# Patient Record
Sex: Female | Born: 1995 | Race: Black or African American | Hispanic: No | Marital: Single | Smoking: Never smoker
Health system: Southern US, Community
[De-identification: ages and names within clinical notes are randomized; demographics above are authoritative.]

## PROBLEM LIST (undated history)

## (undated) ENCOUNTER — Ambulatory Visit: Admission: EM | Discharge status: Absent without leave | Payer: Self-pay

## (undated) DIAGNOSIS — R7989 Other specified abnormal findings of blood chemistry: Secondary | ICD-10-CM

## (undated) DIAGNOSIS — K219 Gastro-esophageal reflux disease without esophagitis: Secondary | ICD-10-CM

## (undated) DIAGNOSIS — J45909 Unspecified asthma, uncomplicated: Secondary | ICD-10-CM

## (undated) DIAGNOSIS — H409 Unspecified glaucoma: Secondary | ICD-10-CM

## (undated) HISTORY — PX: WISDOM TOOTH EXTRACTION: SHX21

## (undated) HISTORY — PX: MOUTH SURGERY: SHX715

## (undated) HISTORY — PX: DILATION AND CURETTAGE OF UTERUS: SHX78

---
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
## 2013-10-06 IMAGING — CR DG THORACIC SPINE 2-3V
1 series · 2 of 2 positions shown · non-contrast
Comparison: none

REASON FOR EXAM: t spine tenderness s/p fall
COMMENTS:

PROCEDURE:     DXR - DXR THORACIC  AP AND LATERAL  - November 10, 2011  [DATE]
RESULT:     No acute bony abnormality identified. No evidence of fracture.
No focal abnormality. Mild scoliosis concave right.

[Series 1: ap · 0.17mm/px · 2 of 2 slices shown]
[im 1/2]
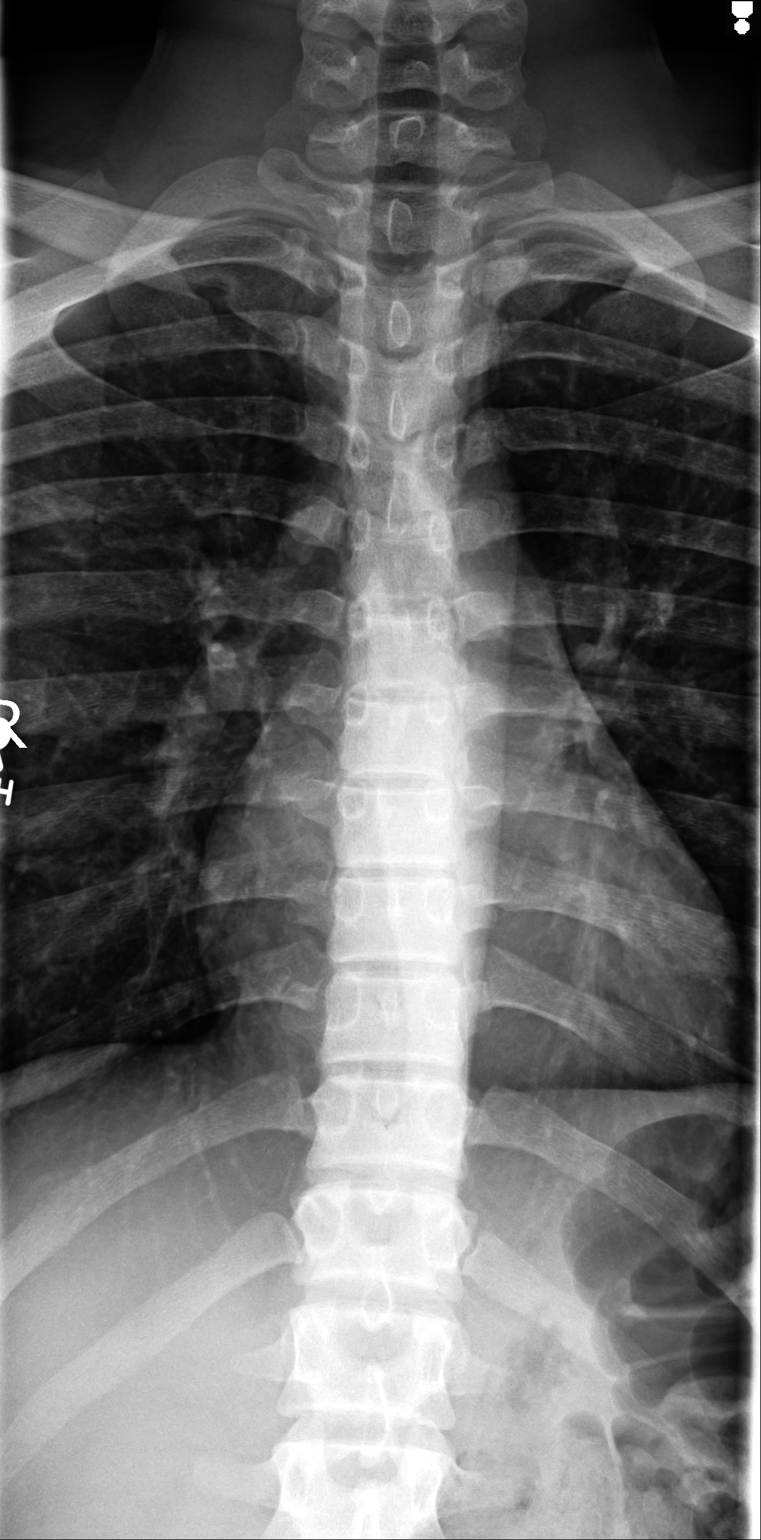
[im 2/2]
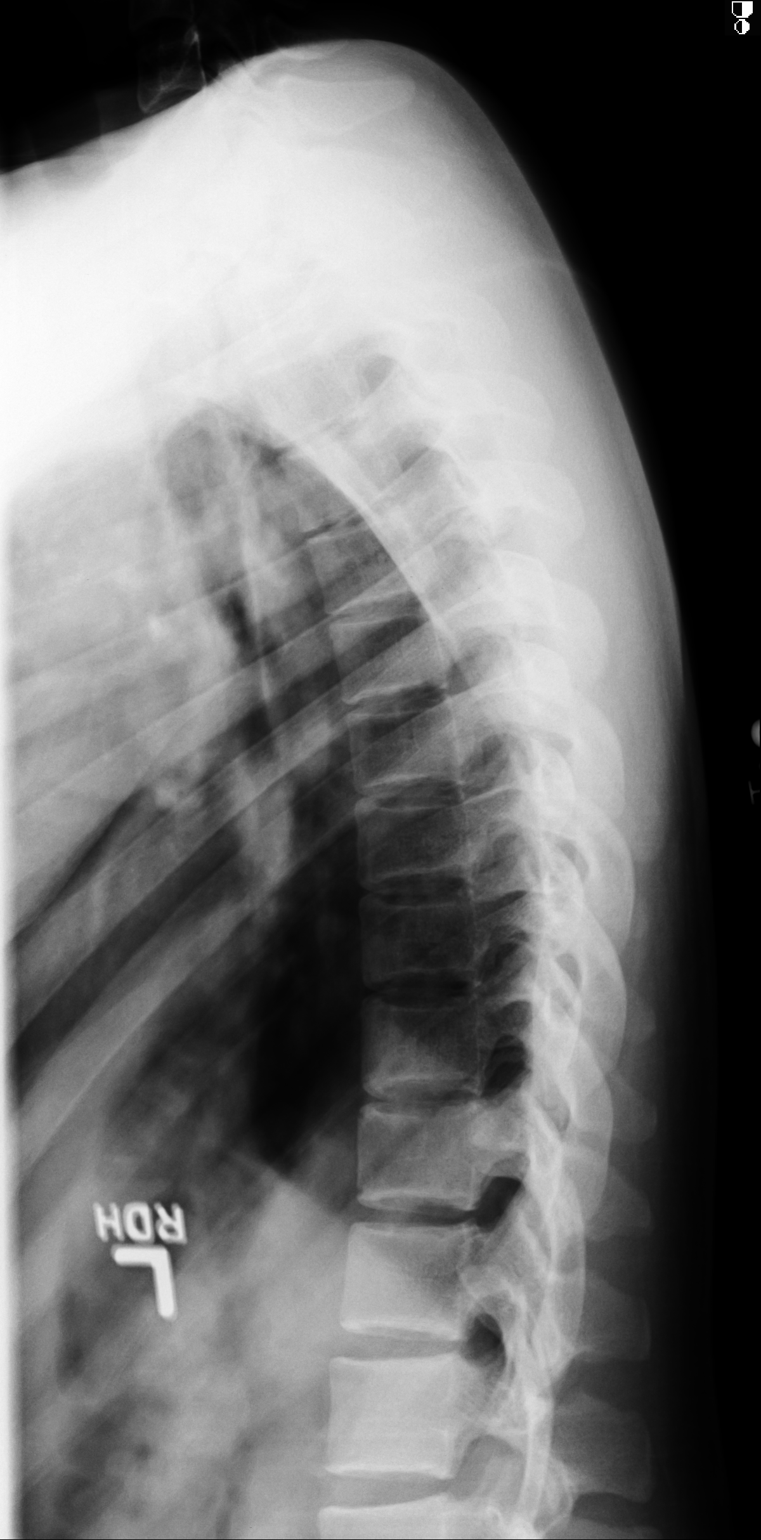

[2 of 2 positions shown; findings below may reference images not displayed]

IMPRESSION: No acute abnormality.

## 2015-04-19 ENCOUNTER — Encounter: Payer: Self-pay | Admitting: Emergency Medicine

## 2015-04-19 ENCOUNTER — Ambulatory Visit
Admission: EM | Admit: 2015-04-19 | Discharge: 2015-04-19 | Disposition: A | Discharge status: Home / Self Care | Payer: Medicaid Other | Attending: Family Medicine | Admitting: Family Medicine

## 2015-04-19 DIAGNOSIS — N390 Urinary tract infection, site not specified: Secondary | ICD-10-CM | POA: Insufficient documentation

## 2015-04-19 DIAGNOSIS — R350 Frequency of micturition: Secondary | ICD-10-CM | POA: Insufficient documentation

## 2015-04-19 HISTORY — DX: Unspecified asthma, uncomplicated: J45.909

## 2015-04-19 LAB — URINALYSIS COMPLETE WITH MICROSCOPIC (ARMC ONLY)
Bilirubin Urine: NEGATIVE
Glucose, UA: NEGATIVE mg/dL
Hgb urine dipstick: NEGATIVE
Ketones, ur: NEGATIVE mg/dL
Leukocytes, UA: NEGATIVE
NITRITE: NEGATIVE
PH: 6 (ref 5.0–8.0)
Protein, ur: NEGATIVE mg/dL
Specific Gravity, Urine: 1.025 (ref 1.005–1.030)

## 2015-04-19 MED ORDER — CIPROFLOXACIN HCL 500 MG PO TABS: 500.0000 mg | ORAL_TABLET | Freq: Two times a day (BID) | ORAL | Status: DC

## 2015-04-19 MED ORDER — PHENAZOPYRIDINE HCL 200 MG PO TABS: 200.0000 mg | ORAL_TABLET | Freq: Three times a day (TID) | ORAL | Status: DC | PRN

## 2015-04-19 NOTE — ED Notes (Signed)
Urinary symptoms for 2 weeks, felt like she was getting better but now getting worse.

## 2015-04-19 NOTE — ED Provider Notes (Signed)
CSN: [id number]     Arrival date & time 04/19/15  0805 History   First MD Initiated Contact with Patient 04/19/15 [phone]    Nurses notes were reviewed. Chief Complaint  Patient presents with  . Urinary Frequency   Patient reports one-day history of burning urination frequency. Many UTIs but has noted them when she has relation with a partner she seems to develop a UTI. She has some burning urination frequency.   (Consider location/radiation/quality/duration/timing/severity/associated sxs/prior Treatment) Patient is a 19 y.o. female presenting with frequency. The history is provided by the patient. No language interpreter was used.  Urinary Frequency This is a new problem. The current episode started yesterday. The problem occurs constantly. The problem has been gradually worsening. Pertinent negatives include no chest pain, no abdominal pain, no headaches and no shortness of breath. Nothing aggravates the symptoms. Nothing relieves the symptoms. She has tried nothing for the symptoms. The treatment provided no relief.    Past Medical History  Diagnosis Date  . Asthma    Past Surgical History  Procedure Laterality Date  . Mouth surgery     History reviewed. No pertinent family history. Social History  Substance Use Topics  . Smoking status: Former Games developer  . Smokeless tobacco: None  . Alcohol Use: Yes   OB History    No data available     Review of Systems  Constitutional: Negative for activity change, appetite change and fatigue.  Respiratory: Negative for shortness of breath.   Cardiovascular: Negative for chest pain.  Gastrointestinal: Negative for abdominal pain.  Genitourinary: Positive for frequency.  Neurological: Negative for headaches.  All other systems reviewed and are negative.   Allergies  Honey bee treatment  Home Medications   Prior to Admission medications   Medication Sig Start Date End Date Taking? Authorizing Provider  ALBUTEROL IN Inhale 90 mcg  into the lungs as needed.   Yes Historical Provider, MD  ciprofloxacin (CIPRO) 500 MG tablet Take 1 tablet (500 mg total) by mouth 2 (two) times daily. 04/19/15   Hassan Rowan, MD  phenazopyridine (PYRIDIUM) 200 MG tablet Take 1 tablet (200 mg total) by mouth 3 (three) times daily as needed for pain. 04/19/15   Hassan Rowan, MD   Meds Ordered and Administered this Visit  Medications - No data to display  BP 110/61 mmHg  Pulse 79  Temp(Src) 97.9 F (36.6 C) (Oral)  Resp 18  Ht  (1.676 m)  Wt 230 lb (104.327 kg)  BMI 37.14 kg/m2  SpO2 100%  LMP 04/19/2015 (Approximate) No data found.   Physical Exam  Constitutional: She appears well-developed and well-nourished.  HENT:  Head: Normocephalic and atraumatic.  Eyes: Conjunctivae are normal. Pupils are equal, round, and reactive to light.  Abdominal: Soft. Bowel sounds are normal. She exhibits no distension.  Musculoskeletal: Normal range of motion.  Neurological: She is alert.  Skin: Skin is warm and dry.  Psychiatric: She has a normal mood and affect. Her behavior is normal.  Vitals reviewed.   ED Course  Procedures (including critical care time)  Labs Review Labs Reviewed  URINALYSIS COMPLETEWITH MICROSCOPIC ([facility] ONLY) - Abnormal; Notable for the following:    APPearance HAZY (*)    Bacteria, UA MANY (*)    Squamous Epithelial / LPF 0-5 (*)    All other components within normal limits  URINE CULTURE    Imaging Review No results found.   Visual Acuity Review  Right Eye Distance:   Left Eye Distance:  Bilateral Distance:    Right Eye Near:   Left Eye Near:    Bilateral Near:     Results for orders placed or performed during the hospital encounter of 04/19/15  Urinalysis complete, with microscopic  Result Value Ref Range   Color, Urine YELLOW YELLOW   APPearance HAZY (A) CLEAR   Glucose, UA NEGATIVE NEGATIVE mg/dL   Bilirubin Urine NEGATIVE NEGATIVE   Ketones, ur NEGATIVE NEGATIVE mg/dL   Specific  Gravity, Urine 1.025 1.005 - 1.030   Hgb urine dipstick NEGATIVE NEGATIVE   pH 6.0 5.0 - 8.0   Protein, ur NEGATIVE NEGATIVE mg/dL   Nitrite NEGATIVE NEGATIVE   Leukocytes, UA NEGATIVE NEGATIVE   RBC / HPF 6-30 0 - 5 RBC/hpf   WBC, UA 0-5 0 - 5 WBC/hpf   Bacteria, UA MANY (A) NONE SEEN   Squamous Epithelial / LPF 0-5 (A) NONE SEEN   Mucous PRESENT      MDM   1. UTI (lower urinary tract infection)     Patient retrieved for UTI with Cipro for a week. Discussed further about the need to always wipe down and the need to go to the bathroom before sexual relations and going afterwards as decided needed. Work note written for today as well.      Hassan RowanEugene Charlaine Utsey, MD 04/19/15 1006

## 2015-04-19 NOTE — Discharge Instructions (Signed)
Antibiotic Medicine °Antibiotic medicines are used to treat infections caused by bacteria. They work by hurting or killing the germs that are making you sick. °HOW WILL MY MEDICINE BE PICKED? °There are many kinds of antibiotic medicines. To help your doctor pick one, tell your doctor if: °· You have any allergies. °· You are pregnant or plan to get pregnant. °· You are breastfeeding. °· You are taking any medicines. These include over-the-counter medicines, prescription medicines, and herbal remedies. °· You have a medical condition or problem. °If you have questions about why your medicine was picked, ask. °FOR HOW LONG SHOULD I TAKE MY MEDICINE? °Take your medicine for as long as your doctor tells you to. Do not stop taking it when you feel better. If you stop taking it too soon: °· You may start to feel sick again. °· Your infection may get harder to treat. °· New problems may develop. °WHAT IF I MISS A DOSE? °Try not to miss any doses of antibiotic medicine. If you miss a dose: °· Take the dose as soon as you can. °· If you are taking 2 doses a day, take the next dose in 5 to 6 hours. °· If you are taking 3 or more doses a day, take the next dose in 2 to 4 hours. Then go back to the normal schedule. °If you cannot take a missed dose, take the next dose on time. Then take the missed dose after you have taken all the doses as told by your doctor, as if you had one more dose left. °DOES THIS MEDICINE AFFECT BIRTH CONTROL? °Birth control pills may not work while you are on antibiotic medicines. If you are taking birth control pills, keep taking them as usual. Use a second form of birth control, such as a condom. Keep using the second form of birth control until you are finished with your current 1 month cycle of birth control pills. °GET HELP IF: °· You get worse. °· You do not feel better a few days after starting the medicine. °· You throw up (vomit). °· There are white patches in your mouth. °· You have new  joint pain after starting the medicine. °· You have new muscle aches after starting the medicine. °· You had a fever before starting the medicine, and it comes back. °· You have any symptoms of an allergic reaction, such as an itchy rash. If this happens, stop taking the medicine. °GET HELP RIGHT AWAY IF: °· Your pee (urine) turns dark or becomes blood-colored. °· Your skin turns yellow. °· You bruise or bleed easily. °· You have very bad watery poop (diarrhea) and cramps in your belly (abdomen). °· You have a very bad headache. °· You have signs of a very bad allergic reaction, such as: °¨ Trouble breathing. °¨ Wheezing. °¨ Swelling of the lips, tongue, or face. °¨ Fainting. °¨ Blisters on the skin or in the mouth. °If you have signs of a very bad allergic reaction, stop taking the antibiotic medicine right away. °  °This information is not intended to replace advice given to you by your health care provider. Make sure you discuss any questions you have with your health care provider. °  °Document Released: 01/17/2008 Document Revised: 12/29/2014 Document Reviewed: 08/25/2014 °Elsevier Interactive Patient Education ©2016 [employer] ° °Urinary Tract Infection °A urinary tract infection (UTI) can occur any place along the urinary tract. The tract includes the kidneys, ureters, bladder, and urethra. A type of germ called bacteria   often causes a UTI. UTIs are often helped with antibiotic medicine.  °HOME CARE  °· If given, take antibiotics as told by your doctor. Finish them even if you start to feel better. °· Drink enough fluids to keep your pee (urine) clear or pale yellow. °· Avoid tea, drinks with caffeine, and bubbly (carbonated) drinks. °· Pee often. Avoid holding your pee in for a long time. °· Pee before and after having sex (intercourse). °· Wipe from front to back after you poop (bowel movement) if you are a woman. Use each tissue only once. °GET HELP RIGHT AWAY IF:  °· You have back pain. °· You have  lower belly (abdominal) pain. °· You have chills. °· You feel sick to your stomach (nauseous). °· You throw up (vomit). °· Your burning or discomfort with peeing does not go away. °· You have a fever. °· Your symptoms are not better in 3 days. °MAKE SURE YOU:  °· Understand these instructions. °· Will watch your condition. °· Will get help right away if you are not doing well or get worse. °  °This information is not intended to replace advice given to you by your health care provider. Make sure you discuss any questions you have with your health care provider. °  °Document Released: 09/26/2007 Document Revised: 04/30/2014 Document Reviewed: 11/08/2011 °Elsevier Interactive Patient Education ©2016 [employer]vier Inc. ° °

## 2015-04-21 LAB — URINE CULTURE: Special Requests: NORMAL

## 2016-02-11 ENCOUNTER — Ambulatory Visit
Admission: EM | Admit: 2016-02-11 | Discharge: 2016-02-11 | Discharge status: Against Medical Advice | Payer: Medicaid Other

## 2016-02-11 DIAGNOSIS — T63441A Toxic effect of venom of bees, accidental (unintentional), initial encounter: Secondary | ICD-10-CM

## 2016-02-11 NOTE — ED Provider Notes (Signed)
CSN: [id number]     Arrival date & time 02/11/16  1058 History   None    Chief Complaint  Patient presents with  . Insect Bite    Bee Sting   (Consider location/radiation/quality/duration/timing/severity/associated sxs/prior Treatment) Single female left prior to provider evaluation during nurse triage bee sting right finger talking on phone full sentences without difficulty stated to nurse she has benadryl at home and father at home.  Will seek re-evaluation if new or worsening symptoms departed ambulatory in NAD respirations even and unlabored gait sure and steady in hall  PMHx allergy honey ingestion anaphylaxis      Past Medical History:  Diagnosis Date  . Asthma    Past Surgical History:  Procedure Laterality Date  . MOUTH SURGERY     No family history on file. Social History  Substance Use Topics  . Smoking status: Former Games developermoker  . Smokeless tobacco: Not on file  . Alcohol use Yes   OB History    No data available     Review of Systems  Constitutional: Negative for chills and fever.  HENT: Negative for ear pain and sore throat.   Eyes: Negative for pain and visual disturbance.  Respiratory: Negative for cough and shortness of breath.   Cardiovascular: Negative for chest pain and palpitations.  Gastrointestinal: Negative for abdominal pain and vomiting.  Genitourinary: Negative for dysuria and hematuria.  Musculoskeletal: Negative for arthralgias and back pain.  Skin: Positive for color change. Negative for rash.  Allergic/Immunologic: Positive for environmental allergies and food allergies.  Neurological: Negative for dizziness, tremors, seizures, syncope, facial asymmetry, speech difficulty, weakness, light-headedness, numbness and headaches.  Hematological: Negative for adenopathy. Does not bruise/bleed easily.  Psychiatric/Behavioral: Negative for sleep disturbance.  All other systems reviewed and are negative.   Allergies  Honey bee treatment [bee  venom]  Home Medications   Prior to Admission medications   Medication Sig Start Date End Date Taking? Authorizing Provider  ALBUTEROL IN Inhale 90 mcg into the lungs as needed.    Historical Provider, MD  ciprofloxacin (CIPRO) 500 MG tablet Take 1 tablet (500 mg total) by mouth 2 (two) times daily. 04/19/15   Hassan RowanEugene Wade, MD  phenazopyridine (PYRIDIUM) 200 MG tablet Take 1 tablet (200 mg total) by mouth 3 (three) times daily as needed for pain. 04/19/15   Hassan RowanEugene Wade, MD   Meds Ordered and Administered this Visit  Medications - No data to display  There were no vitals taken for this visit. No data found.   Physical Exam  Constitutional: She is oriented to person, place, and time. She appears well-developed and well-nourished. No distress.  HENT:  Head: Normocephalic and atraumatic.  Right Ear: External ear normal.  Left Ear: External ear normal.  Mouth/Throat: Oropharynx is clear and moist.  Eyes: Conjunctivae and EOM are normal. Pupils are equal, round, and reactive to light. Right eye exhibits no discharge. Left eye exhibits no discharge. No scleral icterus.  Neck: Normal range of motion. Neck supple.  Cardiovascular: Normal rate and regular rhythm.   Pulmonary/Chest: Effort normal and breath sounds normal. No stridor. No respiratory distress.  Musculoskeletal: Normal range of motion.  Neurological: She is alert and oriented to person, place, and time.  Skin: Skin is warm and dry. Capillary refill takes less than 2 seconds. She is not diaphoretic.  Psychiatric: She has a normal mood and affect. Her behavior is normal. Judgment and thought content normal.  Nursing note and vitals reviewed.   Urgent Care Course  Clinical Course    Procedures (including critical care time)  Labs Review Labs Reviewed - No data to display  Imaging Review No results found.       MDM   1. Bee sting, accidental or unintentional, initial encounter    Seek re-evaluation if worsening  of symptoms e.g. Dyspnea/dysphagia/tongue swelling/shortness of breath/vomiting.    Barbaraann Barthel, NP 02/11/16 1524

## 2016-02-11 NOTE — ED Notes (Signed)
Patient was called back to the exam room. Upon entering exam room she decided she didn't need to be seen, that she was going to put something cold on it and take some Benadryl, RN advised her to let the provider see her however she decided to leave.

## 2016-02-12 ENCOUNTER — Ambulatory Visit
Admission: EM | Admit: 2016-02-12 | Discharge: 2016-02-12 | Disposition: A | Discharge status: Home / Self Care | Payer: Medicaid Other | Attending: Family Medicine | Admitting: Family Medicine

## 2016-02-12 DIAGNOSIS — T63441A Toxic effect of venom of bees, accidental (unintentional), initial encounter: Secondary | ICD-10-CM

## 2016-02-12 DIAGNOSIS — L299 Pruritus, unspecified: Secondary | ICD-10-CM | POA: Diagnosis not present

## 2016-02-12 MED ORDER — TRIAMCINOLONE ACETONIDE 0.1 % EX CREA: 1.0000 "application " | TOPICAL_CREAM | Freq: Two times a day (BID) | CUTANEOUS | 0 refills | Status: DC

## 2016-02-12 NOTE — ED Provider Notes (Signed)
CSN: [id number]     Arrival date & time 02/12/16  1549 History   None    Chief Complaint  Patient presents with  . Insect Bite   (Consider location/radiation/quality/duration/timing/severity/associated sxs/prior Treatment) HPI  This a 20 year old female who is seen today because of a yellow jacket sting to the dorsum of her right ring finger that occurred yesterday. She was actually here in the clinic yesterday but left about being seen because of reaction not being to severe". She decided to leave and they use Benadryl at home. 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 Today she returns complaining that her hand is very itchy and still swollen. She needs a note for return to work.     Past Medical History:  Diagnosis Date  . Asthma    Past Surgical History:  Procedure Laterality Date  . MOUTH SURGERY     History reviewed. No pertinent family history. Social History  Substance Use Topics  . Smoking status: Former Games developermoker  . Smokeless tobacco: Never Used  . Alcohol use Yes   OB History    No data available     Review of Systems  Constitutional: Positive for activity change. Negative for chills, fatigue and fever.  Skin: Positive for wound.    Allergies  Honey bee treatment [bee venom]  Home Medications   Prior to Admission medications   Medication Sig Start Date End Date Taking? Authorizing Provider  ALBUTEROL IN Inhale 90 mcg into the lungs as needed.   Yes Historical Provider, MD  diphenhydrAMINE (BENADRYL) 25 mg capsule Take 25 mg by mouth every 6 (six) hours as needed.   Yes Historical Provider, MD  triamcinolone cream (KENALOG) 0.1 % Apply 1 application topically 2 (two) times daily. 02/12/16   Lutricia FeilWilliam P Roemer, PA-C   Meds Ordered and Administered this Visit  Medications - No data to display  BP 110/78 (BP Location: Left Arm)   Pulse 75   Temp 98 F (36.7 C) (Oral)   Resp 18   Ht 5\' 6"  (1.676 m)   Wt 230 lb (104.3 kg)   LMP 02/12/2016   SpO2 99%   BMI 37.12  kg/m  No data found.   Physical Exam  Constitutional: She is oriented to person, place, and time. She appears well-developed and well-nourished. No distress.  HENT:  Head: Normocephalic and atraumatic.  Eyes: EOM are normal. Pupils are equal, round, and reactive to light.  Neck: Normal range of motion. Neck supple.  Pulmonary/Chest: Effort normal and breath sounds normal. No respiratory distress. She has no wheezes. She has no rales.  Musculoskeletal: Normal range of motion. She exhibits edema and tenderness.  Neurological: She is alert and oriented to person, place, and time.  Skin: Skin is warm and dry. She is not diaphoretic. There is erythema.  Examination of the right dominant ring finger over the dorsum of the PIP joint is some erythema swelling erythema extending down her finger to the MP joints. Range of motion is full but on, 4 at the very extremes of flexion due to the swelling. Swelling is very minimal.  Psychiatric: She has a normal mood and affect. Her behavior is normal. Judgment and thought content normal.  Nursing note and vitals reviewed.   Urgent Care Course   Clinical Course    Procedures (including critical care time)  Labs Review Labs Reviewed - No data to display  Imaging Review No results found.   Visual Acuity Review  Right Eye Distance:  Left Eye Distance:   Bilateral Distance:    Right Eye Near:   Left Eye Near:    Bilateral Near:         MDM   1. Bee sting, accidental or unintentional, initial encounter    Discharge Medication List as of 02/12/2016  4:15 PM    START taking these medications   Details  triamcinolone cream (KENALOG) 0.1 % Apply 1 application topically 2 (two) times daily., Starting Sun 02/12/2016, Normal      Plan: 1. Test/x-ray results and diagnosis reviewed with patient 2. rx as per orders; risks, benefits, potential side effects reviewed with patient 3. Recommend supportive treatment with Ice and elevation as  necessary for comfort. Will prescribe some steroid cream to help with the itching. I recommended that she use H2 blockers for several days also may use either Claritin or Allegra cause it is less sedating. Use Benadryl nighttime. Follow-up when necessary 4. F/u prn if symptoms worsen or don't improve     Lutricia Feil, PA-C 02/12/16 1631

## 2016-02-12 NOTE — ED Triage Notes (Signed)
Patient was stung by a yellow jacket yesterday morning. Finger is swollen and hurting

## 2016-02-15 ENCOUNTER — Telehealth: Payer: Self-pay

## 2016-02-15 NOTE — Telephone Encounter (Signed)
Courtesy call back completed today after patient's visit at [facility]. Patient improved and will call back with any questions or concerns.  

## 2016-03-07 ENCOUNTER — Ambulatory Visit
Admission: EM | Admit: 2016-03-07 | Discharge: 2016-03-07 | Disposition: A | Discharge status: Home / Self Care | Payer: Medicaid Other | Attending: Family Medicine | Admitting: Family Medicine

## 2016-03-07 DIAGNOSIS — N390 Urinary tract infection, site not specified: Secondary | ICD-10-CM | POA: Insufficient documentation

## 2016-03-07 DIAGNOSIS — B3731 Acute candidiasis of vulva and vagina: Secondary | ICD-10-CM

## 2016-03-07 DIAGNOSIS — B373 Candidiasis of vulva and vagina: Secondary | ICD-10-CM

## 2016-03-07 LAB — URINALYSIS COMPLETE WITH MICROSCOPIC (ARMC ONLY)
GLUCOSE, UA: NEGATIVE mg/dL
NITRITE: NEGATIVE
Protein, ur: 30 mg/dL — AB
pH: 5.5 (ref 5.0–8.0)

## 2016-03-07 MED ORDER — FLUCONAZOLE 150 MG PO TABS: 150.0000 mg | ORAL_TABLET | Freq: Every day | ORAL | 1 refills | Status: DC

## 2016-03-07 NOTE — ED Triage Notes (Signed)
Pt c/o urinary discomfort. She was treated for Chlamydia a couple weeks ago. But she is still having some discharge.

## 2016-03-07 NOTE — ED Provider Notes (Signed)
[facility]    CSN: [id number] Arrival date & time: 03/07/16  1340     History   Chief Complaint Chief Complaint  Patient presents with  . Urinary Tract Infection    HPI Ashley Morrow is a 20 y.o. female.   20 yo female with a c/o urinary discomfort. States she was treated for Chlamydia a couple weeks ago and symptoms improved. She was also treated for a UTI the week prior.  Now reports she is still having some mild discharge and itching, both of which improved with otc monistat for several days. States that since the treatment for chlamydia, she has not had any intercourse or other sexual activity.    The history is provided by the patient.    Past Medical History:  Diagnosis Date  . Asthma     There are no active problems to display for this patient.   Past Surgical History:  Procedure Laterality Date  . MOUTH SURGERY      OB History    No data available       Home Medications    Prior to Admission medications   Medication Sig Start Date End Date Taking? Authorizing Provider  ALBUTEROL IN Inhale 90 mcg into the lungs as needed.    Historical Provider, MD  diphenhydrAMINE (BENADRYL) 25 mg capsule Take 25 mg by mouth every 6 (six) hours as needed.    Historical Provider, MD  fluconazole (DIFLUCAN) 150 MG tablet Take 1 tablet (150 mg total) by mouth daily. 03/07/16   Payton Mccallumrlando Debbra Digiulio, MD  triamcinolone cream (KENALOG) 0.1 % Apply 1 application topically 2 (two) times daily. 02/12/16   Lutricia FeilWilliam P Roemer, PA-C    Family History History reviewed. No pertinent family history.  Social History Social History  Substance Use Topics  . Smoking status: Former Games developermoker  . Smokeless tobacco: Never Used  . Alcohol use Yes     Allergies   Honey bee treatment [bee venom]   Review of Systems Review of Systems   Physical Exam Triage Vital Signs ED Triage Vitals  Enc Vitals Group     BP 03/07/16 1402 118/72     Pulse Rate 03/07/16 1402 94     Resp  03/07/16 1402 18     Temp 03/07/16 1402 98 F (36.7 C)     Temp Source 03/07/16 1402 Oral     SpO2 03/07/16 1402 99 %     Weight 03/07/16 1402 230 lb (104.3 kg)     Height 03/07/16 1402 5\' 6"  (1.676 m)     Head Circumference --      Peak Flow --      Pain Score 03/07/16 1403 3     Pain Loc --      Pain Edu? --      Excl. in GC? --    No data found.   Updated Vital Signs BP 118/72 (BP Location: Left Arm)   Pulse 94   Temp 98 F (36.7 C) (Oral)   Resp 18   Ht 5\' 6"  (1.676 m)   Wt 230 lb (104.3 kg)   LMP 02/12/2016   SpO2 99%   BMI 37.12 kg/m   Visual Acuity Right Eye Distance:   Left Eye Distance:   Bilateral Distance:    Right Eye Near:   Left Eye Near:    Bilateral Near:     Physical Exam  Constitutional: She appears well-developed and well-nourished. No distress.  Skin: She is not diaphoretic.  Nursing note and vitals reviewed.    UC Treatments / Results  Labs (all labs ordered are listed, but only abnormal results are displayed) Labs Reviewed  URINALYSIS COMPLETEWITH MICROSCOPIC ([facility] ONLY) - Abnormal; Notable for the following:       Result Value   APPearance CLOUDY (*)    Bilirubin Urine SMALL (*)    Ketones, ur TRACE (*)    Specific Gravity, Urine >1.030 (*)    Hgb urine dipstick TRACE (*)    Protein, ur 30 (*)    Leukocytes, UA MODERATE (*)    Bacteria, UA MANY (*)    Squamous Epithelial / LPF TOO NUMEROUS TO COUNT (*)    All other components within normal limits  URINE CULTURE  CHLAMYDIA/NGC RT PCR ([facility] ONLY)    EKG  EKG Interpretation None       Radiology No results found.  Procedures Procedures (including critical care time)  Medications Ordered in UC Medications - No data to display   Initial Impression / Assessment and Plan / UC Course  I have reviewed the triage vital signs and the nursing notes.  Pertinent labs & imaging results that were available during my care of the patient were reviewed by me and  considered in my medical decision making (see chart for details).  Clinical Course       Final Clinical Impressions(s) / UC Diagnoses   Final diagnoses:  Yeast vaginitis    New Prescriptions New Prescriptions   FLUCONAZOLE (DIFLUCAN) 150 MG TABLET    Take 1 tablet (150 mg total) by mouth daily.   1. Lab results and diagnosis reviewed with patient 2. rx as per orders above; reviewed possible side effects, interactions, risks and benefits  3. Check cultures/tests as per orders (patient will be notified) 4. Recommend supportive treatment with increased water intake 5. Follow-up prn if symptoms worsen or don't improve   Payton Mccallumrlando Barre Aydelott, MD 03/07/16 [phone]

## 2016-03-08 ENCOUNTER — Telehealth: Payer: Self-pay | Admitting: *Deleted

## 2016-03-08 LAB — CHLAMYDIA/NGC RT PCR (ARMC ONLY)
Chlamydia Tr: NOT DETECTED
N gonorrhoeae: NOT DETECTED

## 2016-03-08 LAB — URINE CULTURE
CULTURE: NO GROWTH
Special Requests: NORMAL

## 2016-03-08 NOTE — Telephone Encounter (Signed)
Called patient, verified DOB, communicated negative chlamydia and gonorrhea test results. Patient reported feeling better and confirmed understanding of test results.

## 2016-10-15 ENCOUNTER — Ambulatory Visit
Admission: EM | Admit: 2016-10-15 | Discharge: 2016-10-15 | Disposition: A | Discharge status: Home / Self Care | Payer: Medicaid Other | Attending: Family Medicine | Admitting: Family Medicine

## 2016-10-15 DIAGNOSIS — N898 Other specified noninflammatory disorders of vagina: Secondary | ICD-10-CM

## 2016-10-15 DIAGNOSIS — Z888 Allergy status to other drugs, medicaments and biological substances status: Secondary | ICD-10-CM | POA: Insufficient documentation

## 2016-10-15 DIAGNOSIS — Z87891 Personal history of nicotine dependence: Secondary | ICD-10-CM | POA: Insufficient documentation

## 2016-10-15 DIAGNOSIS — N926 Irregular menstruation, unspecified: Secondary | ICD-10-CM | POA: Insufficient documentation

## 2016-10-15 DIAGNOSIS — J45909 Unspecified asthma, uncomplicated: Secondary | ICD-10-CM | POA: Diagnosis not present

## 2016-10-15 DIAGNOSIS — B373 Candidiasis of vulva and vagina: Secondary | ICD-10-CM

## 2016-10-15 DIAGNOSIS — B3731 Acute candidiasis of vulva and vagina: Secondary | ICD-10-CM

## 2016-10-15 DIAGNOSIS — Z79899 Other long term (current) drug therapy: Secondary | ICD-10-CM | POA: Insufficient documentation

## 2016-10-15 DIAGNOSIS — B9689 Other specified bacterial agents as the cause of diseases classified elsewhere: Secondary | ICD-10-CM

## 2016-10-15 DIAGNOSIS — N760 Acute vaginitis: Secondary | ICD-10-CM | POA: Insufficient documentation

## 2016-10-15 LAB — WET PREP, GENITAL
Sperm: NONE SEEN
TRICH WET PREP: NONE SEEN

## 2016-10-15 MED ORDER — FLUCONAZOLE 150 MG PO TABS: 150.0000 mg | ORAL_TABLET | Freq: Once | ORAL | 0 refills | Status: AC

## 2016-10-15 MED ORDER — METRONIDAZOLE 500 MG PO TABS: 500.0000 mg | ORAL_TABLET | Freq: Two times a day (BID) | ORAL | 0 refills | Status: AC

## 2016-10-15 NOTE — Discharge Instructions (Addendum)
Recommend start Flagyl 500mg  twice a day as directed- do not drink alcohol while on this medication. Take Diflucan 150mg  one tablet now, repeat another tablet in 3 days and final tablet at end of antibiotic use.

## 2016-10-15 NOTE — ED Triage Notes (Signed)
Unprotected sex 4 days ago, then started with yellow vaginal discharge. No urinary sx. Denies pain in triage

## 2016-10-16 LAB — CHLAMYDIA/NGC RT PCR (ARMC ONLY)
Chlamydia Tr: NOT DETECTED
N GONORRHOEAE: NOT DETECTED
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
## 2017-04-23 DIAGNOSIS — J189 Pneumonia, unspecified organism: Secondary | ICD-10-CM

## 2017-04-23 HISTORY — PX: DILATION AND CURETTAGE OF UTERUS: SHX78

## 2017-04-23 HISTORY — DX: Pneumonia, unspecified organism: J18.9

## 2017-08-30 ENCOUNTER — Encounter: Payer: Self-pay | Admitting: Emergency Medicine

## 2017-08-30 ENCOUNTER — Emergency Department
Admission: EM | Admit: 2017-08-30 | Discharge: 2017-08-31 | Disposition: A | Discharge status: Home / Self Care | Payer: Self-pay | Attending: Emergency Medicine | Admitting: Emergency Medicine

## 2017-08-30 DIAGNOSIS — Z87891 Personal history of nicotine dependence: Secondary | ICD-10-CM | POA: Insufficient documentation

## 2017-08-30 DIAGNOSIS — F329 Major depressive disorder, single episode, unspecified: Secondary | ICD-10-CM | POA: Insufficient documentation

## 2017-08-30 DIAGNOSIS — Z79899 Other long term (current) drug therapy: Secondary | ICD-10-CM | POA: Insufficient documentation

## 2017-08-30 DIAGNOSIS — F320 Major depressive disorder, single episode, mild: Secondary | ICD-10-CM

## 2017-08-30 DIAGNOSIS — J45909 Unspecified asthma, uncomplicated: Secondary | ICD-10-CM | POA: Insufficient documentation

## 2017-08-30 DIAGNOSIS — R45851 Suicidal ideations: Secondary | ICD-10-CM | POA: Insufficient documentation

## 2017-08-30 LAB — COMPREHENSIVE METABOLIC PANEL
ALK PHOS: 63 U/L (ref 38–126)
ALT: 17 U/L (ref 14–54)
AST: 21 U/L (ref 15–41)
Albumin: 3.9 g/dL (ref 3.5–5.0)
Anion gap: 5 (ref 5–15)
BILIRUBIN TOTAL: 0.6 mg/dL (ref 0.3–1.2)
BUN: 10 mg/dL (ref 6–20)
CALCIUM: 9 mg/dL (ref 8.9–10.3)
CHLORIDE: 105 mmol/L (ref 101–111)
CO2: 27 mmol/L (ref 22–32)
CREATININE: 1.04 mg/dL — AB (ref 0.44–1.00)
Glucose, Bld: 101 mg/dL — ABNORMAL HIGH (ref 65–99)
Potassium: 3.7 mmol/L (ref 3.5–5.1)
Sodium: 137 mmol/L (ref 135–145)
Total Protein: 7.8 g/dL (ref 6.5–8.1)

## 2017-08-30 LAB — URINE DRUG SCREEN, QUALITATIVE (ARMC ONLY)
Amphetamines, Ur Screen: NOT DETECTED
BARBITURATES, UR SCREEN: NOT DETECTED
Benzodiazepine, Ur Scrn: NOT DETECTED
CANNABINOID 50 NG, UR ~~LOC~~: POSITIVE — AB
COCAINE METABOLITE, UR ~~LOC~~: NOT DETECTED
MDMA (ECSTASY) UR SCREEN: NOT DETECTED
Methadone Scn, Ur: NOT DETECTED
Opiate, Ur Screen: NOT DETECTED
PHENCYCLIDINE (PCP) UR S: NOT DETECTED
Tricyclic, Ur Screen: NOT DETECTED

## 2017-08-30 LAB — CBC
HCT: 39.7 % (ref 35.0–47.0)
Hemoglobin: 13.1 g/dL (ref 12.0–16.0)
MCH: 29 pg (ref 26.0–34.0)
MCHC: 33 g/dL (ref 32.0–36.0)
MCV: 87.6 fL (ref 80.0–100.0)
PLATELETS: 363 10*3/uL (ref 150–440)
RBC: 4.53 MIL/uL (ref 3.80–5.20)
RDW: 12.9 % (ref 11.5–14.5)
WBC: 6.2 10*3/uL (ref 3.6–11.0)

## 2017-08-30 LAB — ACETAMINOPHEN LEVEL: Acetaminophen (Tylenol), Serum: <10 ug/mL — ABNORMAL LOW (ref 10–30)

## 2017-08-30 LAB — SALICYLATE LEVEL: Salicylate Lvl: <7 mg/dL (ref 2.8–30.0)

## 2017-08-30 LAB — POCT PREGNANCY, URINE: PREG TEST UR: NEGATIVE

## 2017-08-30 LAB — ETHANOL

## 2017-08-30 MED ORDER — METOCLOPRAMIDE HCL 10 MG PO TABS
10.0000 mg | ORAL_TABLET | Freq: Once | ORAL | Status: AC
  Administered 2017-08-30: 10 mg via ORAL
  Filled 2017-08-30: qty 1

## 2017-08-30 MED ORDER — ACETAMINOPHEN 325 MG PO TABS
650.0000 mg | ORAL_TABLET | Freq: Once | ORAL | Status: AC
  Administered 2017-08-30: 650 mg via ORAL
  Filled 2017-08-30: qty 2

## 2017-08-30 MED ORDER — METOCLOPRAMIDE HCL 5 MG/ML IJ SOLN: 10.0000 mg | Freq: Once | INTRAMUSCULAR | Status: DC

## 2017-08-30 MED ORDER — ONDANSETRON 4 MG PO TBDP
8.0000 mg | ORAL_TABLET | Freq: Once | ORAL | Status: AC
  Administered 2017-08-30: 8 mg via ORAL
  Filled 2017-08-30: qty 2
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
## 2017-08-30 NOTE — ED Triage Notes (Addendum)
Patient presents to the ED via [facility] under IVC papers from [facility].  Patient told a friend earlier today that she was having thoughts of hurting herself.  Patient is unwilling to share thoughts about a plan stating, "I'd rather not say", but she reports suicidal ideation.  Patient states she stopped taking her medication for depression about 1 month ago.  Patient states, "It didn't seem like it was working, but now that I've stopped taking it, I've been worse."  Patient had a miscarriage at 14 weeks last month and is having issues with her mother and her boyfriend.  Patient states mother's day is bringing up difficult emotions.  Patient is tearful in triage.  Patient also reports difficulty because many friends she went to school with are graduating from college and she is not.  Patient states, "I know a lot of other people go through worse things, but this just feels like a lot."  Patient also reports her father recently had a heart attack.

## 2017-08-30 NOTE — ED Notes (Signed)
IVC soc called 

## 2017-08-30 NOTE — ED Notes (Signed)
Attempted to talk to pt. Pt tearful, upset that she is in hall. Attempted to explain to pt that hall bed was only bed available and that we would move her into room when one was available. Pt kept saying that if there were no rooms then she should go home. Stated that she was hungry but refused food because she was in hall where everyone could see her. Sheriff told me that pt had been complaining of headache. Asked pt about HA and she stated that she wanted to be left alone.

## 2017-08-30 NOTE — ED Provider Notes (Signed)
[facility] Emergency Department Provider Note ____________________________________________   First MD Initiated Contact with Patient 08/30/17 1643     (approximate)  I have reviewed the triage vital signs and the nursing notes.   HISTORY  Chief Complaint Suicidal  Level 5 caveat: History of present illness limited due to uncooperative historian.  HPI Ashley Morrow is a 22 y.o. female with PMH of asthma who presents with suicidal ideation.  She states she stopped taking medication for depression about 1 month ago and reports a recent miscarriage as well as conflict with family members.  She declines to state whether she has a plan.  The patient denies any acute medical complaints except for nausea and mild frontal headache.  Past Medical History:  Diagnosis Date  . Asthma     There are no active problems to display for this patient.   Past Surgical History:  Procedure Laterality Date  . DILATION AND CURETTAGE OF UTERUS    . MOUTH SURGERY      Prior to Admission medications   Medication Sig Start Date End Date Taking? Authorizing Provider  albuterol (PROVENTIL HFA;VENTOLIN HFA) 108 (90 Base) MCG/ACT inhaler Inhale 2 puffs into the lungs every 4 (four) hours as needed. 08/29/17  Yes [provider]  cetirizine (ZYRTEC) 10 MG tablet Take 10 mg by mouth daily. 07/05/17  Yes [provider]  etonogestrel (IMPLANON) 68 MG IMPL implant 1 each by Subdermal route once.    [provider]    Allergies Honey and Honey bee treatment [bee venom]  No family history on file.  Social History Social History   Tobacco Use  . Smoking status: Former Games developer  . Smokeless tobacco: Never Used  Substance Use Topics  . Alcohol use: Yes    Comment: social  . Drug use: No    Review of Systems  Constitutional: No fever. Eyes: No redness. ENT: No neck pain. Cardiovascular: Denies chest pain. Respiratory: Denies shortness of  breath. Gastrointestinal: Positive for nausea..  Genitourinary: Negative for dysuria.  Musculoskeletal: Negative for back pain. Skin: Negative for rash. Neurological: Positive for mild headache.   ____________________________________________   PHYSICAL EXAM:  VITAL SIGNS: ED Triage Vitals  Enc Vitals Group     BP 08/30/17 1555 125/83     Pulse Rate 08/30/17 1555 83     Resp 08/30/17 1555 16     Temp 08/30/17 1555 99.3 F (37.4 C)     Temp Source 08/30/17 1555 Oral     SpO2 08/30/17 1555 100 %     Weight 08/30/17 1556 233 lb 12.8 oz (106.1 kg)     Height 08/30/17 1556  (1.676 m)     Head Circumference --      Peak Flow --      Pain Score 08/30/17 1556 6     Pain Loc --      Pain Edu? --      Excl. in GC? --     Constitutional: Alert and oriented. Well appearing and in no acute distress. Eyes: Conjunctivae are normal.  Head: Atraumatic. Nose: No congestion/rhinnorhea. Mouth/Throat: Mucous membranes are moist.   Neck: Normal range of motion.  Cardiovascular:  Good peripheral circulation. Respiratory: Normal respiratory effort.  Gastrointestinal: No distention.  Musculoskeletal:  Extremities warm and well perfused.  Neurologic:  Normal speech and language. No gross focal neurologic deficits are appreciated.  Skin:  Skin is warm and dry. No rash noted. Psychiatric: Speech and behavior are normal.  ____________________________________________   LABS (all labs ordered are listed, but only abnormal results are displayed)  Labs Reviewed  COMPREHENSIVE METABOLIC PANEL - Abnormal; Notable for the following components:      Result Value   Glucose, Bld 101 (*)    Creatinine, Ser 1.04 (*)    All other components within normal limits  ACETAMINOPHEN LEVEL - Abnormal; Notable for the following components:   Acetaminophen (Tylenol), Serum <10 (*)    All other components within normal limits  URINE DRUG SCREEN, QUALITATIVE ([facility] ONLY) - Abnormal; Notable for the  following components:   Cannabinoid 50 Ng, Ur  POSITIVE (*)    All other components within normal limits  ETHANOL  SALICYLATE LEVEL  CBC  POC URINE PREG, ED  POCT PREGNANCY, URINE   ____________________________________________  EKG   ____________________________________________  RADIOLOGY    ____________________________________________   PROCEDURES  Procedure(s) performed: No  Procedures  Critical Care performed: No ____________________________________________   INITIAL IMPRESSION / ASSESSMENT AND PLAN / ED COURSE  Pertinent labs & imaging results that were available during my care of the patient were reviewed by me and considered in my medical decision making (see chart for details).  22 year old female with PMH as noted above presents under IVC paperwork from [facility] for suicidal ideation.  On exam, vital signs are normal, the patient is comfortable appearing, and the remainder the exam is as described above.  Plan: Labs for medical clearance, psych consult, and dispo per psych recommendations.    ----------------------------------------- 11:48 PM on 08/30/2017 -----------------------------------------  Lab work-up is unremarkable.  Patient has been moved to the [facility].  She is pending [facility] consult.  ____________________________________________   FINAL CLINICAL IMPRESSION(S) / ED DIAGNOSES  Final diagnoses:  Suicidal ideation      NEW MEDICATIONS STARTED DURING THIS VISIT:  New Prescriptions   No medications on file     Note:  This document was prepared using Dragon voice recognition software and may include unintentional dictation errors.    Dionne Bucy, MD 08/30/17 2348

## 2017-08-30 NOTE — ED Notes (Signed)
Pt took shower and returned to [facility]. Bathroom has been cleaned and linens and trash removed from bathroom.

## 2017-08-30 NOTE — ED Notes (Signed)

## 2017-08-31 MED ORDER — ALBUTEROL SULFATE HFA 108 (90 BASE) MCG/ACT IN AERS
1.0000 | INHALATION_SPRAY | RESPIRATORY_TRACT | Status: DC | PRN
  Filled 2017-08-31: qty 6.7

## 2017-08-31 NOTE — ED Notes (Signed)
Patient is alert and oriented x 4.  Presents with calm affect and pleasant mood.  Verbalizes readiness for discharge.  Denies suicidal thoughts, auditory and visual hallucinations.  Routine safety checks maintained every 15 minutes.  Patient remained safe on the unit.  No complain of pain or discomfort reported or noted.  Personal belongings returned to patient upon discharge.

## 2017-08-31 NOTE — ED Provider Notes (Addendum)
tell psychiatry feels patient is okay to be discharged. He wants her to follow-up with her primary care doctor to be started on antidepressant he does not want to start her on one here. I will follow his instructions and give her follow-upavailability with [facility] as well.   Arnaldo Natal, MD 08/31/17 0120 will let her stay until morning. It's I think not a good idea to discharge summary 139 morning.   Arnaldo Natal, MD 08/31/17 [phone]

## 2017-08-31 NOTE — Discharge Instructions (Signed)
[facility] feels your okay to go. They watched to follow-up with her primary care physician or OB/GYN doctor and start an antidepressant. I will also give you follow-up instructions for [facility] encase he needs any further assistance. They have a walk-in clinic if you get to depressed.

## 2022-06-26 ENCOUNTER — Other Ambulatory Visit: Payer: Self-pay | Admitting: Orthopedic Surgery

## 2022-06-26 DIAGNOSIS — M2242 Chondromalacia patellae, left knee: Secondary | ICD-10-CM

## 2022-06-28 ENCOUNTER — Ambulatory Visit
Admission: RE | Admit: 2022-06-28 | Discharge: 2022-06-28 | Disposition: A | Discharge status: Home / Self Care | Payer: Medicaid Other | Source: Ambulatory Visit | Attending: Orthopedic Surgery | Admitting: Orthopedic Surgery

## 2022-06-28 DIAGNOSIS — M2242 Chondromalacia patellae, left knee: Secondary | ICD-10-CM

## 2022-08-21 ENCOUNTER — Other Ambulatory Visit: Payer: Self-pay | Admitting: Orthopedic Surgery

## 2022-08-24 ENCOUNTER — Other Ambulatory Visit: Payer: Self-pay | Admitting: Orthopedic Surgery

## 2022-08-30 ENCOUNTER — Inpatient Hospital Stay
Admission: RE | Admit: 2022-08-30 | Discharge: 2022-08-30 | Disposition: A | Discharge status: Home / Self Care | Payer: Medicaid Other | Source: Ambulatory Visit

## 2022-08-30 VITALS — Ht 67.0 in | Wt 249.1 lb

## 2022-08-30 DIAGNOSIS — Z01818 Encounter for other preprocedural examination: Secondary | ICD-10-CM

## 2022-09-03 ENCOUNTER — Encounter: Payer: Self-pay | Admitting: Orthopedic Surgery

## 2022-09-03 ENCOUNTER — Encounter
Admission: RE | Admit: 2022-09-03 | Discharge: 2022-09-03 | Disposition: A | Discharge status: Home / Self Care | Payer: Medicaid Other | Source: Ambulatory Visit | Attending: Orthopedic Surgery | Admitting: Orthopedic Surgery

## 2022-09-03 DIAGNOSIS — Z01818 Encounter for other preprocedural examination: Secondary | ICD-10-CM

## 2022-09-03 HISTORY — DX: Other specified abnormal findings of blood chemistry: R79.89

## 2022-09-03 NOTE — Patient Instructions (Addendum)
Your procedure is scheduled on:09-07-22 Friday Report to the Registration Desk on the 1st floor of the [facility].Then proceed to the 2nd floor Surgery Desk To find out your arrival time, please call [phone] between 1PM - 3PM on:09-06-22 Thursday If your arrival time is 6:00 am, do not arrive before that time as the [facility] entrance doors do not open until 6:00 am.  REMEMBER: Instructions that are not followed completely may result in serious medical risk, up to and including death; or upon the discretion of your surgeon and anesthesiologist your surgery may need to be rescheduled.  Do not eat food after midnight the night before surgery.  No gum chewing or hard candies.  You may however, drink CLEAR liquids up to 2 hours before you are scheduled to arrive for your surgery. Do not drink anything within 2 hours of your scheduled arrival time.  Clear liquids include: - water  - apple juice without pulp - gatorade (not RED colors) - black coffee or tea (Do NOT add milk or creamers to the coffee or tea) Do NOT drink anything that is not on this list.  In addition, your doctor has ordered for you to drink the provided:  Ensure Pre-Surgery Clear Carbohydrate Drink  Drinking this carbohydrate drink up to two hours before surgery helps to reduce insulin resistance and improve patient outcomes. Please complete drinking 2 hours before scheduled arrival time.  One week prior to surgery: Stop Anti-inflammatories (NSAIDS) such as Advil, Aleve, Ibuprofen, Motrin, Naproxen, Naprosyn and Aspirin based products such as Excedrin, Goody's Powder, BC Powder.You may however, take Tylenol if needed for pain up until the day of surgery. Stop ANY OVER THE COUNTER supplements/vitamins NOW (09-03-22) until after surgery (Multivitamin, Probiotic Vaginal Health and Apple Cider Vinegar)   Continue taking all prescribed medications with the exception of the following: -Stop your phentermine (ADIPEX-P) NOW  (09-03-22)  TAKE ONLY THESE MEDICATIONS THE MORNING OF SURGERY WITH A SIP OF WATER: -cetirizine (ZYRTEC)  -zafirlukast (ACCOLATE)   Use your Breo Ellipta, Spiriva and Albuterol Inhaler the day of surgery and bring your Albuterol Inhaler to the hospital  No Alcohol for 24 hours before or after surgery.  No Smoking including e-cigarettes for 24 hours before surgery.  No chewable tobacco products for at least 6 hours before surgery.  No nicotine patches on the day of surgery.  Do not use any "recreational" drugs for at least a week (preferably 2 weeks) before your surgery.  Please be advised that the combination of cocaine and anesthesia may have negative outcomes, up to and including death. If you test positive for cocaine, your surgery will be cancelled.  On the morning of surgery brush your teeth with toothpaste and water, you may rinse your mouth with mouthwash if you wish. Do not swallow any toothpaste or mouthwash.  Use CHG Soap as directed on instruction sheet.  Do not wear jewelry, make-up, hairpins, clips or nail polish.  Do not wear lotions, powders, or perfumes.   Do not shave body hair from the neck down 48 hours before surgery.  Contact lenses, hearing aids and dentures may not be worn into surgery.  Do not bring valuables to the hospital. [facility] is not responsible for any missing/lost belongings or valuables.   Notify your doctor if there is any change in your medical condition (cold, fever, infection).  Wear comfortable clothing (specific to your surgery type) to the hospital.  After surgery, you can help prevent lung complications by doing  breathing exercises.  Take deep breaths and cough every 1-2 hours. Your doctor may order a device called an Incentive Spirometer to help you take deep breaths. When coughing or sneezing, hold a pillow firmly against your incision with both hands. This is called "splinting." Doing this helps protect your incision. It also  decreases belly discomfort.  If you are being admitted to the hospital overnight, leave your suitcase in the car. After surgery it may be brought to your room.  In case of increased patient census, it may be necessary for you, the patient, to continue your postoperative care in the Same Day Surgery department.  If you are being discharged the day of surgery, you will not be allowed to drive home. You will need a responsible individual to drive you home and stay with you for 24 hours after surgery.   If you are taking public transportation, you will need to have a responsible individual with you.  Please call the Pre-admissions Testing Dept. at [phone] if you have any questions about these instructions.  Surgery Visitation Policy:  Patients having surgery or a procedure may have two visitors.  Children under the age of 58 must have an adult with them who is not the patient.  Inpatient Visitation:    Visiting hours are 7 a.m. to 8 p.m. Up to four visitors are allowed at one time in a patient room. The visitors may rotate out with other people during the day.  One visitor age 25 or older may stay with the patient overnight and must be in the room by 8 p.m.     Preparing for Surgery with CHLORHEXIDINE GLUCONATE (CHG) Soap  Chlorhexidine Gluconate (CHG) Soap  o An antiseptic cleaner that kills germs and bonds with the skin to continue killing germs even after washing  o Used for showering the night before surgery and morning of surgery  Before surgery, you can play an important role by reducing the number of germs on your skin.  CHG (Chlorhexidine gluconate) soap is an antiseptic cleanser which kills germs and bonds with the skin to continue killing germs even after washing.  Please do not use if you have an allergy to CHG or antibacterial soaps. If your skin becomes reddened/irritated stop using the CHG.  1. Shower the NIGHT BEFORE SURGERY and the MORNING OF SURGERY with  CHG soap.  2. If you choose to wash your hair, wash your hair first as usual with your normal shampoo.  3. After shampooing, rinse your hair and body thoroughly to remove the shampoo.  4. Use CHG as you would any other liquid soap. You can apply CHG directly to the skin and wash gently with a scrungie or a clean washcloth.  5. Apply the CHG soap to your body only from the neck down. Do not use on open wounds or open sores. Avoid contact with your eyes, ears, mouth, and genitals (private parts). Wash face and genitals (private parts) with your normal soap.  6. Wash thoroughly, paying special attention to the area where your surgery will be performed.  7. Thoroughly rinse your body with warm water.  8. Do not shower/wash with your normal soap after using and rinsing off the CHG soap.  9. Pat yourself dry with a clean towel.  10. Wear clean pajamas to bed the night before surgery.  12. Place clean sheets on your bed the night of your first shower and do not sleep with pets.  13. Shower again with the  CHG soap on the day of surgery prior to arriving at the hospital.  14. Do not apply any deodorants/lotions/powders.  15. Please wear clean clothes to the hospital.   How to Use an Incentive Spirometer An incentive spirometer is a tool that measures how well you are filling your lungs with each breath. Learning to take long, deep breaths using this tool can help you keep your lungs clear and active. This may help to reverse or lessen your chance of developing breathing (pulmonary) problems, especially infection. You may be asked to use a spirometer: After a surgery. If you have a lung problem or a history of smoking. After a long period of time when you have been unable to move or be active. If the spirometer includes an indicator to show the highest number that you have reached, your health care provider or respiratory therapist will help you set a goal. Keep a log of your progress as told  by your health care provider. What are the risks? Breathing too quickly may cause dizziness or cause you to pass out. Take your time so you do not get dizzy or light-headed. If you are in pain, you may need to take pain medicine before doing incentive spirometry. It is harder to take a deep breath if you are having pain. How to use your incentive spirometer  Sit up on the edge of your bed or on a chair. Hold the incentive spirometer so that it is in an upright position. Before you use the spirometer, breathe out normally. Place the mouthpiece in your mouth. Make sure your lips are closed tightly around it. Breathe in slowly and as deeply as you can through your mouth, causing the piston or the ball to rise toward the top of the chamber. Hold your breath for 3-5 seconds, or for as long as possible. If the spirometer includes a coach indicator, use this to guide you in breathing. Slow down your breathing if the indicator goes above the marked areas. Remove the mouthpiece from your mouth and breathe out normally. The piston or ball will return to the bottom of the chamber. Rest for a few seconds, then repeat the steps 10 or more times. Take your time and take a few normal breaths between deep breaths so that you do not get dizzy or light-headed. Do this every 1-2 hours when you are awake. If the spirometer includes a goal marker to show the highest number you have reached (best effort), use this as a goal to work toward during each repetition. After each set of 10 deep breaths, cough a few times. This will help to make sure that your lungs are clear. If you have an incision on your chest or abdomen from surgery, place a pillow or a rolled-up towel firmly against the incision when you cough. This can help to reduce pain while taking deep breaths and coughing. General tips When you are able to get out of bed: Walk around often. Continue to take deep breaths and cough in order to clear your  lungs. Keep using the incentive spirometer until your health care provider says it is okay to stop using it. If you have been in the hospital, you may be told to keep using the spirometer at home. Contact a health care provider if: You are having difficulty using the spirometer. You have trouble using the spirometer as often as instructed. Your pain medicine is not giving enough relief for you to use the spirometer as told. You  have a fever. Get help right away if: You develop shortness of breath. You develop a cough with bloody mucus from the lungs. You have fluid or blood coming from an incision site after you cough. Summary An incentive spirometer is a tool that can help you learn to take long, deep breaths to keep your lungs clear and active. You may be asked to use a spirometer after a surgery, if you have a lung problem or a history of smoking, or if you have been inactive for a long period of time. Use your incentive spirometer as instructed every 1-2 hours while you are awake. If you have an incision on your chest or abdomen, place a pillow or a rolled-up towel firmly against your incision when you cough. This will help to reduce pain. Get help right away if you have shortness of breath, you cough up bloody mucus, or blood comes from your incision when you cough. This information is not intended to replace advice given to you by your health care provider. Make sure you discuss any questions you have with your health care provider. Document Revised: 06/29/2019 Document Reviewed: 06/29/2019 Elsevier Patient Education  2023 [employer].

## 2022-09-07 ENCOUNTER — Ambulatory Visit: Payer: Medicaid Other | Admitting: General Practice

## 2022-09-07 ENCOUNTER — Other Ambulatory Visit: Payer: Self-pay

## 2022-09-07 ENCOUNTER — Ambulatory Visit: Payer: Medicaid Other

## 2022-09-07 ENCOUNTER — Encounter: Payer: Self-pay | Admitting: Orthopedic Surgery

## 2022-09-07 ENCOUNTER — Observation Stay
Admission: RE | Admit: 2022-09-07 | Discharge: 2022-09-10 | Disposition: A | Discharge status: Home / Self Care | Payer: Medicaid Other | Attending: Orthopedic Surgery | Admitting: Orthopedic Surgery

## 2022-09-07 ENCOUNTER — Encounter
Admission: RE | Disposition: A | Discharge status: Home / Self Care | Payer: Self-pay | Source: Home / Self Care | Attending: Orthopedic Surgery

## 2022-09-07 DIAGNOSIS — J45909 Unspecified asthma, uncomplicated: Secondary | ICD-10-CM | POA: Diagnosis not present

## 2022-09-07 DIAGNOSIS — M238X9 Other internal derangements of unspecified knee: Secondary | ICD-10-CM

## 2022-09-07 DIAGNOSIS — Z7952 Long term (current) use of systemic steroids: Secondary | ICD-10-CM | POA: Diagnosis not present

## 2022-09-07 DIAGNOSIS — M222X2 Patellofemoral disorders, left knee: Principal | ICD-10-CM | POA: Insufficient documentation

## 2022-09-07 DIAGNOSIS — Z01818 Encounter for other preprocedural examination: Secondary | ICD-10-CM

## 2022-09-07 DIAGNOSIS — Z79899 Other long term (current) drug therapy: Secondary | ICD-10-CM | POA: Diagnosis not present

## 2022-09-07 DIAGNOSIS — M2242 Chondromalacia patellae, left knee: Secondary | ICD-10-CM | POA: Diagnosis not present

## 2022-09-07 HISTORY — PX: OSTEOCHONDRAL DEFECT REPAIR/RECONSTRUCTION: SHX6232

## 2022-09-07 HISTORY — DX: Unspecified glaucoma: H40.9

## 2022-09-07 HISTORY — PX: CHONDROPLASTY: SHX5177

## 2022-09-07 HISTORY — DX: Gastro-esophageal reflux disease without esophagitis: K21.9

## 2022-09-07 LAB — POCT PREGNANCY, URINE: Preg Test, Ur: NEGATIVE

## 2022-09-07 SURGERY — APPLICATION, GRAFT, OSTEOCHONDRAL, KNEE
Anesthesia: General | Site: Knee | Laterality: Left

## 2022-09-07 MED ORDER — HYDROMORPHONE HCL 1 MG/ML IJ SOLN
0.5000 mg | INTRAMUSCULAR | Status: DC | PRN
  Administered 2022-09-07: 0.5 mg via INTRAVENOUS

## 2022-09-07 MED ORDER — ONDANSETRON HCL 4 MG/2ML IJ SOLN: 4.0000 mg | Freq: Four times a day (QID) | INTRAMUSCULAR | Status: DC | PRN

## 2022-09-07 MED ORDER — PHENYLEPHRINE HCL (PRESSORS) 10 MG/ML IV SOLN
INTRAVENOUS | Status: DC | PRN
  Administered 2022-09-07 (×5): 80 ug via INTRAVENOUS

## 2022-09-07 MED ORDER — ACETAMINOPHEN 10 MG/ML IV SOLN
INTRAVENOUS | Status: DC | PRN
  Administered 2022-09-07: 1000 mg via INTRAVENOUS

## 2022-09-07 MED ORDER — HYDROMORPHONE HCL 1 MG/ML IJ SOLN
0.5000 mg | INTRAMUSCULAR | Status: DC | PRN
  Administered 2022-09-07 – 2022-09-09 (×6): 1 mg via INTRAVENOUS
  Filled 2022-09-07 (×6): qty 1

## 2022-09-07 MED ORDER — BUPIVACAINE HCL (PF) 0.5 % IJ SOLN
INTRAMUSCULAR | Status: AC
  Filled 2022-09-07: qty 30

## 2022-09-07 MED ORDER — CHLORHEXIDINE GLUCONATE 0.12 % MT SOLN
OROMUCOSAL | Status: AC
  Filled 2022-09-07: qty 15

## 2022-09-07 MED ORDER — 0.9 % SODIUM CHLORIDE (POUR BTL) OPTIME
TOPICAL | Status: DC | PRN
  Administered 2022-09-07: 1000 mL

## 2022-09-07 MED ORDER — CEFAZOLIN SODIUM-DEXTROSE 2-4 GM/100ML-% IV SOLN
2.0000 g | INTRAVENOUS | Status: AC
  Administered 2022-09-07: 2 g via INTRAVENOUS

## 2022-09-07 MED ORDER — PROPOFOL 10 MG/ML IV BOLUS
INTRAVENOUS | Status: AC
  Filled 2022-09-07: qty 20

## 2022-09-07 MED ORDER — FENTANYL CITRATE (PF) 100 MCG/2ML IJ SOLN
INTRAMUSCULAR | Status: AC
  Filled 2022-09-07: qty 2

## 2022-09-07 MED ORDER — ROCURONIUM BROMIDE 100 MG/10ML IV SOLN
INTRAVENOUS | Status: DC | PRN
  Administered 2022-09-07: 60 mg via INTRAVENOUS

## 2022-09-07 MED ORDER — OXYCODONE HCL 5 MG PO TABS
5.0000 mg | ORAL_TABLET | ORAL | Status: DC | PRN
  Administered 2022-09-07: 10 mg via ORAL
  Filled 2022-09-07 (×3): qty 2

## 2022-09-07 MED ORDER — OXYCODONE HCL 5 MG PO TABS
10.0000 mg | ORAL_TABLET | ORAL | Status: DC | PRN
  Administered 2022-09-07 – 2022-09-08 (×3): 10 mg via ORAL
  Filled 2022-09-07: qty 2
  Filled 2022-09-07: qty 3
  Filled 2022-09-07: qty 2

## 2022-09-07 MED ORDER — FENTANYL CITRATE (PF) 100 MCG/2ML IJ SOLN
25.0000 ug | INTRAMUSCULAR | Status: DC | PRN
  Administered 2022-09-07 (×4): 25 ug via INTRAVENOUS

## 2022-09-07 MED ORDER — DIPHENHYDRAMINE HCL 12.5 MG/5ML PO ELIX
12.5000 mg | ORAL_SOLUTION | ORAL | Status: DC | PRN
  Administered 2022-09-08 – 2022-09-09 (×3): 25 mg via ORAL
  Filled 2022-09-07 (×4): qty 10

## 2022-09-07 MED ORDER — DOCUSATE SODIUM 100 MG PO CAPS
100.0000 mg | ORAL_CAPSULE | Freq: Two times a day (BID) | ORAL | Status: DC
  Administered 2022-09-07 – 2022-09-10 (×7): 100 mg via ORAL
  Filled 2022-09-07 (×7): qty 1

## 2022-09-07 MED ORDER — NEOSTIGMINE METHYLSULFATE 10 MG/10ML IV SOLN
INTRAVENOUS | Status: AC
  Filled 2022-09-07: qty 1

## 2022-09-07 MED ORDER — FLUTICASONE FUROATE-VILANTEROL 100-25 MCG/ACT IN AEPB
1.0000 | INHALATION_SPRAY | Freq: Every day | RESPIRATORY_TRACT | Status: DC
  Administered 2022-09-07 – 2022-09-10 (×4): 1 via RESPIRATORY_TRACT
  Filled 2022-09-07: qty 28

## 2022-09-07 MED ORDER — EPINEPHRINE PF 1 MG/ML IJ SOLN
INTRAMUSCULAR | Status: AC
  Filled 2022-09-07: qty 4

## 2022-09-07 MED ORDER — SENNOSIDES-DOCUSATE SODIUM 8.6-50 MG PO TABS: 1.0000 | ORAL_TABLET | Freq: Every evening | ORAL | Status: DC | PRN

## 2022-09-07 MED ORDER — LACTATED RINGERS IV SOLN: INTRAVENOUS | Status: DC

## 2022-09-07 MED ORDER — FAMOTIDINE 20 MG PO TABS: 20.0000 mg | ORAL_TABLET | Freq: Once | ORAL | Status: DC

## 2022-09-07 MED ORDER — DEXMEDETOMIDINE HCL IN NACL 80 MCG/20ML IV SOLN
INTRAVENOUS | Status: DC | PRN
  Administered 2022-09-07: 10 ug via INTRAVENOUS

## 2022-09-07 MED ORDER — MIDAZOLAM HCL 2 MG/2ML IJ SOLN
INTRAMUSCULAR | Status: DC | PRN
  Administered 2022-09-07: 1 mg via INTRAVENOUS
  Administered 2022-09-07: 2 mg via INTRAVENOUS
  Administered 2022-09-07: 1 mg via INTRAVENOUS

## 2022-09-07 MED ORDER — LIDOCAINE-EPINEPHRINE 1 %-1:100000 IJ SOLN
INTRAMUSCULAR | Status: AC
  Filled 2022-09-07: qty 1

## 2022-09-07 MED ORDER — LIDOCAINE HCL (PF) 2 % IJ SOLN
INTRAMUSCULAR | Status: AC
  Filled 2022-09-07: qty 5

## 2022-09-07 MED ORDER — OXYCODONE HCL 5 MG PO TABS
5.0000 mg | ORAL_TABLET | Freq: Once | ORAL | Status: AC | PRN
  Administered 2022-09-07: 5 mg via ORAL

## 2022-09-07 MED ORDER — BUPIVACAINE LIPOSOME 1.3 % IJ SUSP
INTRAMUSCULAR | Status: AC
  Filled 2022-09-07: qty 20

## 2022-09-07 MED ORDER — ONDANSETRON HCL 4 MG PO TABS: 4.0000 mg | ORAL_TABLET | Freq: Four times a day (QID) | ORAL | Status: DC | PRN

## 2022-09-07 MED ORDER — OXYCODONE HCL 5 MG PO TABS
ORAL_TABLET | ORAL | Status: AC
  Filled 2022-09-07: qty 1

## 2022-09-07 MED ORDER — DIPHENHYDRAMINE HCL 50 MG/ML IJ SOLN
INTRAMUSCULAR | Status: AC
  Filled 2022-09-07: qty 1

## 2022-09-07 MED ORDER — FENTANYL CITRATE (PF) 100 MCG/2ML IJ SOLN
INTRAMUSCULAR | Status: DC | PRN
  Administered 2022-09-07 (×3): 50 ug via INTRAVENOUS

## 2022-09-07 MED ORDER — ACETAMINOPHEN 500 MG PO TABS
1000.0000 mg | ORAL_TABLET | Freq: Three times a day (TID) | ORAL | Status: DC
  Administered 2022-09-07 – 2022-09-10 (×9): 1000 mg via ORAL
  Filled 2022-09-07 (×9): qty 2

## 2022-09-07 MED ORDER — LIDOCAINE HCL (CARDIAC) PF 100 MG/5ML IV SOSY
PREFILLED_SYRINGE | INTRAVENOUS | Status: DC | PRN
  Administered 2022-09-07: 100 mg via INTRAVENOUS

## 2022-09-07 MED ORDER — BUPIVACAINE HCL (PF) 0.5 % IJ SOLN
INTRAMUSCULAR | Status: AC
  Filled 2022-09-07: qty 10

## 2022-09-07 MED ORDER — ALBUTEROL SULFATE (2.5 MG/3ML) 0.083% IN NEBU: 3.0000 mL | INHALATION_SOLUTION | RESPIRATORY_TRACT | Status: DC | PRN

## 2022-09-07 MED ORDER — ORAL CARE MOUTH RINSE: 15.0000 mL | Freq: Once | OROMUCOSAL | Status: AC

## 2022-09-07 MED ORDER — DEXAMETHASONE SODIUM PHOSPHATE 10 MG/ML IJ SOLN
INTRAMUSCULAR | Status: DC | PRN
  Administered 2022-09-07: 10 mg via INTRAVENOUS

## 2022-09-07 MED ORDER — HYDROMORPHONE HCL 1 MG/ML IJ SOLN
INTRAMUSCULAR | Status: AC
  Filled 2022-09-07: qty 1

## 2022-09-07 MED ORDER — HYDROMORPHONE HCL 1 MG/ML IJ SOLN
0.2000 mg | INTRAMUSCULAR | Status: DC | PRN
  Administered 2022-09-07: 0.4 mg via INTRAVENOUS
  Filled 2022-09-07: qty 1

## 2022-09-07 MED ORDER — MIDAZOLAM HCL 2 MG/2ML IJ SOLN
INTRAMUSCULAR | Status: AC
  Filled 2022-09-07: qty 2

## 2022-09-07 MED ORDER — GLYCOPYRROLATE 0.2 MG/ML IJ SOLN
INTRAMUSCULAR | Status: AC
  Filled 2022-09-07: qty 3

## 2022-09-07 MED ORDER — OXYCODONE HCL 5 MG/5ML PO SOLN: 5.0000 mg | Freq: Once | ORAL | Status: AC | PRN

## 2022-09-07 MED ORDER — METHOCARBAMOL 1000 MG/10ML IJ SOLN: 500.0000 mg | Freq: Four times a day (QID) | INTRAVENOUS | Status: DC | PRN

## 2022-09-07 MED ORDER — TIOTROPIUM BROMIDE MONOHYDRATE 18 MCG IN CAPS
18.0000 ug | ORAL_CAPSULE | Freq: Every day | RESPIRATORY_TRACT | Status: DC
  Administered 2022-09-07 – 2022-09-10 (×4): 18 ug via RESPIRATORY_TRACT
  Filled 2022-09-07: qty 5

## 2022-09-07 MED ORDER — CHLORHEXIDINE GLUCONATE 0.12 % MT SOLN
15.0000 mL | Freq: Once | OROMUCOSAL | Status: AC
  Administered 2022-09-07: 15 mL via OROMUCOSAL

## 2022-09-07 MED ORDER — CEFAZOLIN SODIUM-DEXTROSE 2-4 GM/100ML-% IV SOLN
INTRAVENOUS | Status: AC
  Filled 2022-09-07: qty 100

## 2022-09-07 MED ORDER — GLYCOPYRROLATE 0.2 MG/ML IJ SOLN
INTRAMUSCULAR | Status: DC | PRN
  Administered 2022-09-07: .2 mg via INTRAVENOUS

## 2022-09-07 MED ORDER — MONTELUKAST SODIUM 10 MG PO TABS
10.0000 mg | ORAL_TABLET | Freq: Every day | ORAL | Status: DC
  Administered 2022-09-07 – 2022-09-09 (×3): 10 mg via ORAL
  Filled 2022-09-07 (×3): qty 1

## 2022-09-07 MED ORDER — METHOCARBAMOL 500 MG PO TABS
500.0000 mg | ORAL_TABLET | Freq: Four times a day (QID) | ORAL | Status: DC | PRN
  Administered 2022-09-07 – 2022-09-09 (×5): 500 mg via ORAL
  Filled 2022-09-07 (×4): qty 1

## 2022-09-07 MED ORDER — LORATADINE 10 MG PO TABS
10.0000 mg | ORAL_TABLET | Freq: Every day | ORAL | Status: DC
  Administered 2022-09-07 – 2022-09-10 (×4): 10 mg via ORAL
  Filled 2022-09-07 (×4): qty 1

## 2022-09-07 MED ORDER — SODIUM CHLORIDE 0.9 % IV SOLN: INTRAVENOUS | Status: DC

## 2022-09-07 MED ORDER — PROPOFOL 10 MG/ML IV BOLUS
INTRAVENOUS | Status: DC | PRN
  Administered 2022-09-07: 150 mg via INTRAVENOUS

## 2022-09-07 MED ORDER — CEFAZOLIN SODIUM-DEXTROSE 2-4 GM/100ML-% IV SOLN
2.0000 g | Freq: Four times a day (QID) | INTRAVENOUS | Status: AC
  Administered 2022-09-07 – 2022-09-08 (×3): 2 g via INTRAVENOUS
  Filled 2022-09-07 (×3): qty 100

## 2022-09-07 MED ORDER — ACETAMINOPHEN 10 MG/ML IV SOLN
INTRAVENOUS | Status: AC
  Filled 2022-09-07: qty 100

## 2022-09-07 MED ORDER — NEOSTIGMINE METHYLSULFATE 10 MG/10ML IV SOLN
INTRAVENOUS | Status: DC | PRN
  Administered 2022-09-07: 4 mg via INTRAVENOUS

## 2022-09-07 MED ORDER — ONDANSETRON HCL 4 MG/2ML IJ SOLN
INTRAMUSCULAR | Status: DC | PRN
  Administered 2022-09-07: 4 mg via INTRAVENOUS

## 2022-09-07 MED ORDER — DIPHENHYDRAMINE HCL 50 MG/ML IJ SOLN
INTRAMUSCULAR | Status: DC | PRN
  Administered 2022-09-07: 12.5 mg via INTRAVENOUS

## 2022-09-07 MED ORDER — SODIUM CHLORIDE 0.9 % IR SOLN
Status: DC | PRN
  Administered 2022-09-07: 3000 mL

## 2022-09-07 MED ORDER — METHOCARBAMOL 500 MG PO TABS
ORAL_TABLET | ORAL | Status: AC
  Filled 2022-09-07: qty 1

## 2022-09-07 MED ORDER — BUPIVACAINE LIPOSOME 1.3 % IJ SUSP
INTRAMUSCULAR | Status: AC
  Filled 2022-09-07: qty 10

## 2022-09-07 MED ORDER — ROCURONIUM BROMIDE 10 MG/ML (PF) SYRINGE
PREFILLED_SYRINGE | INTRAVENOUS | Status: AC
  Filled 2022-09-07: qty 10

## 2022-09-07 MED ORDER — ASPIRIN 325 MG PO TBEC
325.0000 mg | DELAYED_RELEASE_TABLET | Freq: Every day | ORAL | Status: DC
  Administered 2022-09-08 – 2022-09-10 (×3): 325 mg via ORAL
  Filled 2022-09-07 (×3): qty 1

## 2022-09-07 MED ORDER — DEXMEDETOMIDINE HCL IN NACL 80 MCG/20ML IV SOLN
INTRAVENOUS | Status: AC
  Filled 2022-09-07: qty 20

## 2022-09-07 MED ORDER — SODIUM CHLORIDE 0.9 % IR SOLN
Status: DC | PRN
  Administered 2022-09-07: 2000 mL

## 2022-09-07 MED ORDER — PHENTERMINE HCL 37.5 MG PO TABS: 37.5000 mg | ORAL_TABLET | Freq: Every day | ORAL | Status: DC

## 2022-09-07 MED ORDER — PHENYLEPHRINE 80 MCG/ML (10ML) SYRINGE FOR IV PUSH (FOR BLOOD PRESSURE SUPPORT)
PREFILLED_SYRINGE | INTRAVENOUS | Status: AC
  Filled 2022-09-07: qty 10

## 2022-09-07 SURGICAL SUPPLY — 85 items
ADH SKN CLS APL DERMABOND .7 (GAUZE/BANDAGES/DRESSINGS) ×1
ALLOGRAFT PATELLA LEFT MOPS (Tissue) IMPLANT
APL PRP STRL LF DISP 70% ISPRP (MISCELLANEOUS) ×1
APL SWBSTK 6 STRL LF DISP (MISCELLANEOUS) ×2
APPLICATOR COTTON TIP 6 STRL (MISCELLANEOUS) ×2 IMPLANT
APPLICATOR COTTON TIP 6IN STRL (MISCELLANEOUS) ×2
BIT DRILL Q COUPLING 4.5 (BIT) IMPLANT
BIT DRILL Q/COUPLING 1 (BIT) IMPLANT
BLADE OSCILLATING/SAGITTAL (BLADE) ×1
BLADE SAW SGTL 13X75X1.27 (BLADE) ×1 IMPLANT
BLADE SHAVER 4.5X7 STR FR (MISCELLANEOUS) IMPLANT
BLADE SURG 15 STRL LF DISP TIS (BLADE) ×1 IMPLANT
BLADE SURG 15 STRL SS (BLADE) ×2
BLADE SW THK.38XMED LNG THN (BLADE) ×1 IMPLANT
CHLORAPREP W/TINT 26 (MISCELLANEOUS) ×1 IMPLANT
COOLER POLAR GLACIER W/PUMP (MISCELLANEOUS) IMPLANT
COUNTER NEEDLE 20/40 LG (NEEDLE) ×1 IMPLANT
COVER MAYO STAND REUSABLE (DRAPES) ×1 IMPLANT
CUFF TOURN SGL QUICK 24 (TOURNIQUET CUFF)
CUFF TOURN SGL QUICK 34 (TOURNIQUET CUFF)
CUFF TRNQT CYL 24X4X16.5-23 (TOURNIQUET CUFF) IMPLANT
CUFF TRNQT CYL 34X4.125X (TOURNIQUET CUFF) IMPLANT
DERMABOND ADVANCED .7 DNX12 (GAUZE/BANDAGES/DRESSINGS) IMPLANT
DRAPE 3/4 80X56 (DRAPES) ×2 IMPLANT
DRAPE C-ARM XRAY 36X54 (DRAPES) ×1 IMPLANT
DRAPE C-ARMOR (DRAPES) ×1 IMPLANT
DRAPE INCISE IOBAN 66X45 STRL (DRAPES) ×2 IMPLANT
DRSG TEGADERM 2-3/8X2-3/4 SM (GAUZE/BANDAGES/DRESSINGS) ×1 IMPLANT
DRSG TEGADERM 4X4.75 (GAUZE/BANDAGES/DRESSINGS) ×1 IMPLANT
DRSG XEROFORM 1X8 (GAUZE/BANDAGES/DRESSINGS) IMPLANT
ELECT REM PT RETURN 9FT ADLT (ELECTROSURGICAL) ×1
ELECTRODE REM PT RTRN 9FT ADLT (ELECTROSURGICAL) ×1 IMPLANT
EVACUATOR 400CC W/10F 1/8 (MISCELLANEOUS) IMPLANT
GAUZE SPONGE 4X4 12PLY STRL (GAUZE/BANDAGES/DRESSINGS) IMPLANT
GLOVE BIOGEL PI IND STRL 8 (GLOVE) ×2 IMPLANT
GLOVE SURG SYN 7.5  E (GLOVE) ×3
GLOVE SURG SYN 7.5 E (GLOVE) ×3 IMPLANT
GLOVE SURG SYN 7.5 PF PI (GLOVE) ×3 IMPLANT
GOWN STRL REUS W/ TWL LRG LVL3 (GOWN DISPOSABLE) ×1 IMPLANT
GOWN STRL REUS W/ TWL XL LVL3 (GOWN DISPOSABLE) ×2 IMPLANT
GOWN STRL REUS W/TWL LRG LVL3 (GOWN DISPOSABLE) ×1
GOWN STRL REUS W/TWL XL LVL3 (GOWN DISPOSABLE) ×2
GRADUATE 1200CC STRL 31836 (MISCELLANEOUS) ×2 IMPLANT
HEMOVAC 400CC 10FR (MISCELLANEOUS) ×2 IMPLANT
KIT TURNOVER KIT A (KITS) ×1 IMPLANT
MANIFOLD NEPTUNE II (INSTRUMENTS) ×1 IMPLANT
NDL KEITH SZ2.5 (NEEDLE) ×2 IMPLANT
NDL SPNL 18GX3.5 QUINCKE PK (NEEDLE) IMPLANT
NEEDLE SPNL 18GX3.5 QUINCKE PK (NEEDLE) ×1 IMPLANT
NS IRRIG 1000ML POUR BTL (IV SOLUTION) ×1 IMPLANT
PACK TOTAL KNEE (MISCELLANEOUS) ×1 IMPLANT
PAD ABD DERMACEA PRESS 5X9 (GAUZE/BANDAGES/DRESSINGS) IMPLANT
PAD WRAPON POLAR KNEE (MISCELLANEOUS) IMPLANT
PATTIES SURGICAL .5 X.5 (GAUZE/BANDAGES/DRESSINGS) ×1 IMPLANT
PIN STEINMAN FIXATION KNEE (PIN) IMPLANT
PULSAVAC PLUS IRRIG FAN TIP (DISPOSABLE) ×1
SCREW CORT ST 4.5X50 (Screw) IMPLANT
SCREW CORTEX ST 4.5X46 (Screw) IMPLANT
SCREW CORTEX ST 4.5X54 (Screw) IMPLANT
SCREW CORTEX ST 4.5X60 (Screw) IMPLANT
SLEEVE REMOTE CONTROL 5X12 (DRAPES) IMPLANT
SPONGE KITTNER 5P (MISCELLANEOUS) ×1 IMPLANT
SUCTION FRAZIER HANDLE 10FR (MISCELLANEOUS) ×1
SUCTION TUBE FRAZIER 10FR DISP (MISCELLANEOUS) ×1 IMPLANT
SUT BONE WAX W31G (SUTURE) ×1 IMPLANT
SUT ETHILON 3-0 FS-10 30 BLK (SUTURE) ×1
SUT MNCRL 4-0 (SUTURE) ×1
SUT MNCRL 4-0 27XMFL (SUTURE) ×1
SUT VIC AB 0 CT1 36 (SUTURE) ×2 IMPLANT
SUT VIC AB 2-0 CT2 27 (SUTURE) ×1 IMPLANT
SUTURE EHLN 3-0 FS-10 30 BLK (SUTURE) IMPLANT
SUTURE MNCRL 4-0 27XMF (SUTURE) IMPLANT
SYR 10ML LL (SYRINGE) ×1 IMPLANT
TIP FAN IRRIG PULSAVAC PLUS (DISPOSABLE) ×1 IMPLANT
TOWEL OR 17X26 4PK STRL BLUE (TOWEL DISPOSABLE) IMPLANT
TRAP FLUID SMOKE EVACUATOR (MISCELLANEOUS) ×1 IMPLANT
TRAY FOLEY SLVR 16FR LF STAT (SET/KITS/TRAYS/PACK) ×1 IMPLANT
TUBE SET DOUBLEFLO INFLOW (TUBING) ×1 IMPLANT
TUBE SET DOUBLEFLO OUTFLOW (TUBING) ×1 IMPLANT
WAND HAND CNTRL MULTIVAC 90 (MISCELLANEOUS) ×1 IMPLANT
WAND WEREWOLF FLOW 90D (MISCELLANEOUS) IMPLANT
WATER STERILE IRR 500ML POUR (IV SOLUTION) ×1 IMPLANT
WIRE Z .045 C-WIRE SPADE TIP (WIRE) ×2 IMPLANT
WIRE Z .062 C-WIRE SPADE TIP (WIRE) ×1 IMPLANT
WRAPON POLAR PAD KNEE (MISCELLANEOUS) ×1

## 2022-09-07 NOTE — Anesthesia Procedure Notes (Signed)
Anesthesia Regional Block: Adductor canal block   Pre-Anesthetic Checklist: , timeout performed,  Correct Patient, Correct Site, Correct Laterality,  Correct Procedure, Correct Position, site marked,  Risks and benefits discussed,  Surgical consent,  Pre-op evaluation,  At surgeon's request and post-op pain management  Laterality: Lower  Prep: chloraprep       Needles:  Injection technique: Single-shot  Needle Type: Echogenic Needle     Needle Length: 9cm  Needle Gauge: 21     Additional Needles:   Procedures:,,,, ultrasound used (permanent image in chart),,    Narrative:  Start time: 09/07/2022 11:05 AM End time: 09/07/2022 11:10 AM Injection made incrementally with aspirations every 5 mL.  Performed by: Personally  Anesthesiologist: Stephanie Coup, MD  Additional Notes: Patient's chart reviewed and they were deemed appropriate candidate for procedure, per surgeon's request. Patient educated about risks, benefits, and alternatives of the block including but not limited to: temporary or permanent nerve damage, bleeding, infection, damage to surround tissues, block failure, local anesthetic toxicity. Patient expressed understanding. A formal time-out was conducted consistent with institution rules.  Monitors were applied, and minimal sedation used (see nursing record). The site was prepped with skin prep and allowed to dry, and sterile gloves were used. A high frequency linear ultrasound probe with probe cover was utilized throughout. Femoral artery visualized at mid-thigh level, local anesthetic injected anterolateral to it, and echogenic block needle trajectory was monitored throughout. Hydrodissection of saphenous nerve visualized and appeared anatomically normal. Aspiration performed every 5ml. Blood vessels were avoided. All injections were performed without resistance and free of blood and paresthesias. The patient tolerated the procedure well. A picture of the nerve block was  added to the patient's chart.  Injectate: 10 cc of exparel and 10 cc of 0.5% bupivacaine

## 2022-09-07 NOTE — Plan of Care (Signed)

## 2022-09-07 NOTE — H&P (Signed)
Paper H&P to be scanned into permanent record. H&P reviewed. No significant changes noted.  

## 2022-09-07 NOTE — Anesthesia Procedure Notes (Signed)
Procedure Name: Intubation Date/Time: 09/07/2022 7:57 AM  Performed by: Smith Potenza, [place], CRNAPre-anesthesia Checklist: Patient identified, Patient being monitored, Timeout performed, Emergency Drugs available and Suction available Patient Re-evaluated:Patient Re-evaluated prior to induction Oxygen Delivery Method: Circle system utilized Preoxygenation: Pre-oxygenation with 100% oxygen Induction Type: IV induction Ventilation: Mask ventilation without difficulty Laryngoscope Size: 3 and McGraph Grade View: Grade I Tube type: Oral Tube size: 7.0 mm Number of attempts: 1 Airway Equipment and Method: Stylet Placement Confirmation: ETT inserted through vocal cords under direct vision, positive ETCO2 and breath sounds checked- equal and bilateral Secured at: 22 cm Tube secured with: Tape Dental Injury: Teeth and Oropharynx as per pre-operative assessment

## 2022-09-07 NOTE — Anesthesia Postprocedure Evaluation (Signed)
Anesthesia Post Note  Patient: Ashley Morrow  Procedure(s) Performed: Left knee arthroscopy with chondroplasty, osteochondroplasty, osteochondral allograft implantation to patella, and anteromedializing tibial tubercle osteotomy (Left: Knee) chondroplasty (Left: Knee)  Patient location during evaluation: PACU Anesthesia Type: General Level of consciousness: awake and alert Pain management: pain level controlled Vital Signs Assessment: post-procedure vital signs reviewed and stable Respiratory status: spontaneous breathing, nonlabored ventilation, respiratory function stable and patient connected to nasal cannula oxygen Cardiovascular status: blood pressure returned to baseline and stable Postop Assessment: no apparent nausea or vomiting Anesthetic complications: no  No notable events documented.   Last Vitals:  Vitals:   09/07/22 1122 09/07/22 1130  BP: 115/60 108/65  Pulse: (!) 102 (!) 101  Resp: 15 18  Temp: (!) 36.1 C   SpO2: 100% 100%    Last Pain:  Vitals:   09/07/22 1122  TempSrc:   PainSc: Asleep                 Stephanie Coup

## 2022-09-07 NOTE — Op Note (Signed)
Operative Note  DATE: 09/07/2022  PRE-OP DIAGNOSIS:  1. Left knee patella chondral defect 2. Left knee patellofemoral malalignment  POST-OP DIAGNOSES:  1. Left knee patella chondral defect 2. Left knee patellofemoral malalignment  PROCEDURES: 1.  Left knee diagnostic arthroscopy 2. Left knee patella osteochondral allograft (20mm x 1) 3. Left knee tibial tubercle osteotomy  SURGEON:  Rosealee Albee, MD  ASSISTANT(S):  Sonny Dandy, PA  ANESTHESIA: Gen + postoperative regional anesthesia  TOTAL IV FLUIDS: see anesthesia record  ESTIMATED BLOOD LOSS: 50cc  TOURNIQUET TIME: 108 minutes.  DRAINS: None  SPECIMENS: None.  IMPLANTS: MTF patella osteochondral allograft, 20mm  COMPLICATIONS: None apparent.  INDICATIONS: Ashley Morrow is a 27 y.o. female with persistent knee pain for ~9 months that has not responded to conservative management including activity modifications, injections, and physical therapy.  Preoperative imaging showed a focal chondral defect with significant subchondral cyst formation. TT-TG distance measured 19 mm.  After discussion of risks, benefits, and alternatives to surgery, the patient elected to proceed with above procedures.    OPERATIVE FINDINGS:    Examination under anesthesia: A careful examination under anesthesia was performed.  Passive range of motion was: Hyperextension: 2.  Extension: 0.  Flexion: 135.  Lachman: normal. Pivot Shift: normal.  Posterior drawer: normal.  Varus stability in full extension: normal.  Varus stability in 30 degrees of flexion: normal.  Valgus stability in full extension: normal.  Valgus stability in 30 degrees of flexion: normal.   Intra-operative findings: A thorough arthroscopic examination of the knee was performed.  The findings are: 1. Suprapatellar pouch: Normal 2. Undersurface of median ridge: Extension of medial patellar facet chondral defect, otherwise normal 3. Medial patellar facet: Grade 4 chondral defect  measuring approximately 19 x 12 mm with extension of defect to the medial ridge 4. Lateral patellar facet: Normal 5. Trochlea: Normal 6. Lateral gutter/popliteus tendon: Normal 7. Hoffa's fat pad: Normal 8. Medial gutter/plica: Normal 9. ACL: Normal 10. PCL: Normal 11. Medial meniscus: Normal 12. Medial compartment cartilage: Normal femoral condyle cartilage.  Normal tibial articular cartilage 13. Lateral meniscus: Normal 14. Lateral compartment cartilage: Grade 1-2 fissuring of the lateral tibial plateau. Normal lateral femoral condyle   OPERATIVE REPORT:     I identified Ashley Morrow in the pre-operative holding area. I marked the operative knee with my initials. I reviewed the risks and benefits of the proposed surgical intervention and the patient (and/or patient's guardian) wished to proceed. The patient was transferred to the operative suite and placed in the supine position with all bony prominences padded.  Anesthesia was administered. Appropriate IV antibiotics were administered prior to incision. The extremity was then prepped and draped in standard fashion. A time out was performed confirming the correct extremity, correct patient, and correct procedure.    A standard anterolateral portal was established with an 11 blade.   The arthroscope was placed in the anterolateral portal and then into the suprapatellar pouch.  A diagnostic knee scope was completed with the above findings. Next, the medial portal was established under needle localization. The patellar chondral defect was identified.   A longitudinal incision was marked from just proximal patella to ~8cm distal to the tibial tubercle.  The leg was elevated and exsanguinated with an Esmarch bandage and tourniquet inflated.  A 10 blade was used to make the long central knee incision from the proximal pole of the patella to 8cm distal to the tibial tubercle.  Medial and lateral flaps were developed.  Next a tibial tubercle  osteotomy was performed.  The patellar tendon was identified and protected.  The anterior compartment musculature was released subperiosteally and protected.  Guidepins were placed at ~45 degree angle and were used to make the osteotomy. The saw blade and osteotomes were used proximally to finish the lateral and medial cuts on the shingle.    Next, we turned our attention to the open osteonchondral allograft portion of the procedure. A medial parapatellar arthrotomy was performed. Synovitis was resected. Care was taken to avoid cutting the medial meniscus. The patella was everted and held against the lateral femoral condyle with towel clips superiorly and inferiorly.  The lesion measured approximately 19 mm medial-lateral x 13 mm superior-inferior.  A 20 mm osteochondral allograft appeared to best remove the damaged cartilage. Next, an MTF Lesion Gauge, which had a width of 20mm, was positioned such that all 3 feet were touching the patellar cartilage. A guidepin was placed through the center of the gauge. The appropriately sized Lesion Reamer was then placed over the guide pin and reamed through sclerotic cancellous bone until the depths of the lesion bled. The appropriately sized dilator was then placed. A depth gauge was used to measure at 12:00, 3 o'clock, 6 o'clock, and 9 o'clock as well as 1:30, 4:30, 7:30, and 10:30 positions.   The osteoarticular allograft patella was then placed in the MTF Graft Station, and the area of the donor graft was matched to the contour of the patient's patella. An appropriately sized GraftMaker was placed over the osteoarticular allograft and reamed down through the graft.  An oscillating saw was then used to remove the osteoarticular plug below the desired depth. The depth measurements were transferred to the graft. The graft was placed into the Graft Forceps, lining up the depth markings with the underside of the forceps jaws. A TPS saw was used to remove additional bone to  match the depths of the walls of the defect. A rongeur was used to fine-tune the depths of the osteochondral allograft plug. The plug was then pulse-lavaged extensively. The graft was then carefully press fit and tamped flush with surrounding cartilage.  This achieved excellent, flush fit from the 12:00 to 9 o'clock position.  There was approximately 1 mm of overhang from the 9:00 to 12 o'clock position.  The cartilage was beveled in this region.  The knee was taken through a range of motion and the graft remained in place without any notable impingement.   Next, the tibial tubercle osteotomy was completed.  The proximal aspect of the tibial tubercle was then elevated and shifted medially ~10 mm, which also anteriorized the shingle 10 mm.  Three, fully-threaded cortical screws, 4.5 mm in diameter were then placed in a lag fashion.  This achieved excellent fixation.  The countersink was used to limit the prominence of the screw heads.  Fluoroscopy was used to confirm appropriate osteotomy and hardware position.  Tourniquet was released.  There was no unusual bleeding.  The distal aspect of the wound was gently irrigated. There was some oozing from the tibial tubercle osteotomy site laterally and bone wax was applied in a thin layer to limit cancellous bleeding.  The medial overhang of the tibial tubercle osteotomy was smoothed with a saw blade.  A medium Hemovac drain was placed along the tibial tubercle osteotomy site. The parapatellar arthrotomy was closed with figure-of-eight stitches of 0 Vicryl. The midline knee incision was then closed with interrupted, inverted, 2-0 Vicryl sutures in the subdermal  layer and then 4-0 Monocryl in a running, subcuticular fashion.  Portal incisions were closed with 3-0 nylon.  Dermabond and honeycomb dressing was applied. Leg was wrapped in cotton and bias wrap.  Polar Care and hinged knee brace locked at 0 degrees was applied.  The patient was brought to PACU in stable  condition.  Instrument, sponge, and needle counts were correct prior to wound closure and at the conclusion of the case.  Of note, assistance from a PA was essential to performing the surgery. PA assisted with patient positioning, retraction, and instrumentation. The surgery would have been more difficult and had longer operative time without PA assistance.    DISPOSITION: PACU - hemodynamically stable.  POSTOPERATIVE PLAN: The patient will be admitted overnight for observation. ASA 325mg /day x 4 weeks for DVT ppx. Perioperative antibiotics x 24 hours. Use tibial tubercle osteotomy plus patella osteochondral allograft rehab protocol.  Nonweightbearing x 4 weeks. 0-30 degree passive RoM immediately.  Physical therapy to start on postoperative day #3-4.  Follow-up with me in approximately 2 weeks.

## 2022-09-07 NOTE — Transfer of Care (Signed)
Immediate Anesthesia Transfer of Care Note  Patient: Ashley Morrow  Procedure(s) Performed: Left knee arthroscopy with chondroplasty, osteochondroplasty, osteochondral allograft implantation to patella, and anteromedializing tibial tubercle osteotomy (Left: Knee) chondroplasty (Left: Knee)  Patient Location: PACU  Anesthesia Type:General  Level of Consciousness: drowsy  Airway & Oxygen Therapy: Patient Spontanous Breathing and Patient connected to face mask oxygen  Post-op Assessment: Report given to RN and Post -op Vital signs reviewed and stable  Post vital signs: Reviewed and stable  Last Vitals:  Vitals Value Taken Time  BP 115/60 09/07/22 1121  Temp 97.6   Pulse 101 09/07/22 1130  Resp 18 09/07/22 1130  SpO2 100 % 09/07/22 1130  Vitals shown include unvalidated device data.  Last Pain:  Vitals:   09/07/22 1610  TempSrc: Temporal  PainSc: 0-No pain      Patients Stated Pain Goal: 0 (09/07/22 9604)  Complications: No notable events documented.

## 2022-09-07 NOTE — Anesthesia Preprocedure Evaluation (Signed)
Anesthesia Evaluation  Patient identified by MRN, date of birth, ID band Patient awake    Reviewed: Allergy & Precautions, NPO status , Patient's Chart, lab work & pertinent test results  Airway Mallampati: II  TM Distance: >3 FB Neck ROM: full    Dental  (+) Dental Advidsory Given, Missing   Pulmonary neg shortness of breath, asthma    Pulmonary exam normal        Cardiovascular (-) angina negative cardio ROS Normal cardiovascular exam(-) Valvular Problems/Murmurs     Neuro/Psych negative neurological ROS  negative psych ROS   GI/Hepatic negative GI ROS, Neg liver ROS,,,  Endo/Other    Morbid obesity  Renal/GU      Musculoskeletal   Abdominal   Peds  Hematology negative hematology ROS (+)   Anesthesia Other Findings Past Medical History: No date: Asthma No date: Elevated serum creatinine     Comment:  mild No date: GERD (gastroesophageal reflux disease)     Comment:  rare No date: Glaucoma 2019: Pneumonia  Past Surgical History: 2019: DILATION AND CURETTAGE OF UTERUS No date: WISDOM TOOTH EXTRACTION  BMI    Body Mass Index: 39.02 kg/m      Reproductive/Obstetrics negative OB ROS                             Anesthesia Physical Anesthesia Plan  ASA: 2  Anesthesia Plan: General ETT   Post-op Pain Management:    Induction: Intravenous  PONV Risk Score and Plan: Ondansetron, Dexamethasone and Midazolam  Airway Management Planned: Oral ETT  Additional Equipment:   Intra-op Plan:   Post-operative Plan: Extubation in OR  Informed Consent: I have reviewed the patients History and Physical, chart, labs and discussed the procedure including the risks, benefits and alternatives for the proposed anesthesia with the patient or authorized representative who has indicated his/her understanding and acceptance.     Dental Advisory Given  Plan Discussed with:  Anesthesiologist, CRNA and Surgeon  Anesthesia Plan Comments: (Patient consented for risks of anesthesia including but not limited to:  - adverse reactions to medications - damage to eyes, teeth, lips or other oral mucosa - nerve damage due to positioning  - sore throat or hoarseness - Damage to heart, brain, nerves, lungs, other parts of body or loss of life  Patient voiced understanding.)       Anesthesia Quick Evaluation

## 2022-09-08 DIAGNOSIS — M222X2 Patellofemoral disorders, left knee: Secondary | ICD-10-CM | POA: Diagnosis not present

## 2022-09-08 LAB — HIV ANTIBODY (ROUTINE TESTING W REFLEX): HIV Screen 4th Generation wRfx: NONREACTIVE

## 2022-09-08 MED ORDER — OXYCODONE HCL 5 MG PO TABS: 5.0000 mg | ORAL_TABLET | ORAL | 0 refills | Status: AC | PRN

## 2022-09-08 MED ORDER — BUPIVACAINE HCL (PF) 0.5 % IJ SOLN
INTRAMUSCULAR | Status: DC | PRN
  Administered 2022-09-07: 10 mL

## 2022-09-08 MED ORDER — HYDROCORTISONE 1 % EX CREA
TOPICAL_CREAM | CUTANEOUS | Status: DC | PRN
  Filled 2022-09-08: qty 28

## 2022-09-08 MED ORDER — METHOCARBAMOL 500 MG PO TABS: 500.0000 mg | ORAL_TABLET | Freq: Four times a day (QID) | ORAL | 0 refills | Status: AC | PRN

## 2022-09-08 MED ORDER — OXYCODONE HCL 5 MG PO TABS
10.0000 mg | ORAL_TABLET | ORAL | Status: DC | PRN
  Administered 2022-09-08: 15 mg via ORAL
  Administered 2022-09-08: 10 mg via ORAL
  Administered 2022-09-08 – 2022-09-09 (×3): 15 mg via ORAL
  Administered 2022-09-09: 10 mg via ORAL
  Administered 2022-09-09: 15 mg via ORAL
  Administered 2022-09-09: 10 mg via ORAL
  Administered 2022-09-10: 15 mg via ORAL
  Filled 2022-09-08 (×2): qty 3
  Filled 2022-09-08: qty 2
  Filled 2022-09-08 (×2): qty 3
  Filled 2022-09-08: qty 2
  Filled 2022-09-08: qty 3

## 2022-09-08 MED ORDER — ONDANSETRON HCL 4 MG PO TABS: 4.0000 mg | ORAL_TABLET | Freq: Four times a day (QID) | ORAL | 0 refills | Status: AC | PRN

## 2022-09-08 MED ORDER — ORAL CARE MOUTH RINSE: 15.0000 mL | OROMUCOSAL | Status: DC | PRN

## 2022-09-08 MED ORDER — ASPIRIN 325 MG PO TBEC: 325.0000 mg | DELAYED_RELEASE_TABLET | Freq: Every day | ORAL | Status: AC

## 2022-09-08 MED ORDER — HYDROCORTISONE 1 % EX CREA: TOPICAL_CREAM | Freq: Four times a day (QID) | CUTANEOUS | Status: DC | PRN

## 2022-09-08 MED ORDER — BUPIVACAINE LIPOSOME 1.3 % IJ SUSP
INTRAMUSCULAR | Status: DC | PRN
  Administered 2022-09-07: 10 mL

## 2022-09-08 NOTE — Discharge Instructions (Signed)
Post-Op Instructions  1. Bracing or crutches: You will be provided with a long brace (from hip to ankle) and crutches at the surgery center.   2. Ice: You will be provided with a device (Polar Care) that allows you to ice the affected area effectively.   3. Showering: Incision must remain dry for 5 days. Afterwards, you may shower and gently pat incision dry. Steri-strips will eventually fall off on their own. NO submerging wound for 4 weeks. There is an absorbable stitch and you may see the ends of the stitch. If applicable, these will be removed at your first post-operative appointment in 2 weeks.   4. Driving: You will be given specific driving precautions at discharge. Plan on not driving for at least one week for left knee surgery, and 4 weeks for right knee surgery if you are restricted due to the brace and knee motion. Please note that you are advised NOT to drive while taking narcotic pain medications as you may be impaired and unsafe to drive.  5. Activity: Weight bearing: No weight bearing on the affected leg for 4 weeks, then 50% weight bearing for 2 weeks, then full weight bearing at 6 weeks. Bending the knee is limited and will be guided by the physical therapist. Elevate knee above heart level as much as possible for one week. Avoid standing more than 5 minutes (consecutively) for the first week. No exercise involving the knee until cleared by the surgeon or physical therapist.  Avoid long distance travel for 4 weeks.  6. Medications: - You have been provided a prescription for narcotic pain medicine. After surgery, take 1-2 narcotic tablets every 4 hours if needed for severe pain.  - A prescription for anti-nausea medication will be provided in case the narcotic medicine causes nausea - take 1 tablet every 6 hours only if nauseated.  - Take enteric coated aspirin 325 mg once daily for 4 weeks to prevent blood clots.  -Take tylenol 1000 every 8 hours for pain.  May stop tylenol 3 days  after surgery or when you are having minimal pain. -DO NOT TAKE IBUPROFEN, ALEVE or OTHER NSAIDs as they can interfere with bone healing.  -Take Citracal Maximum Strength (calcium citrate + vitamin D), 2 tabs daily.  If you are taking prescription medication for anxiety, depression, insomnia, muscle spasm, chronic pain, or for attention deficit disorder, you are advised that you are at a higher risk of adverse effects with use of narcotics post-op, including narcotic addiction/dependence, depressed breathing, death. If you use non-prescribed substances: alcohol, marijuana, cocaine, heroin, methamphetamines, etc., you are at a higher risk of adverse effects with use of narcotics post-op, including narcotic addiction/dependence, depressed breathing, death. You are advised that taking > 50 morphine milligram equivalents (MME) of narcotic pain medication per day results in twice the risk of overdose or death. For your prescription provided: oxycodone 5 mg - taking more than 6 tablets per day would result in > 50 morphine milligram equivalents (MME) of narcotic pain medication. Be advised that we will prescribe narcotics short-term, for acute post-operative pain, only 3 weeks for major operations such as knee repair/reconstruction surgeries.   7. Bandages: The physical therapist should change the bandages at the first post-op appointment. If needed, the dressing supplies have been provided to you.  8. Physical Therapy: 2 times per week for the first 4 weeks, then 1-2 times per week from weeks 4-8 post-op. Therapy typically starts on post operative Day 3 or 4. You have been   provided an order for physical therapy today and should schedule your appointments in advance to avoid delay. The therapist will provide home exercises.  9. Work or School: For most, but not all procedures, we advise staying out of work or school for at least 1 to 2 weeks in order to recover from the stress of surgery and to allow time for  healing and swelling control. If you need a work or school note this can be provided.   10. Post-Op Appointments: Your first post-op appointment will be with Dr. Patel in approximately 2 weeks time. Please double check if this will be at the [place] facility (Tuesdays and Thursdays) or [facility] facility (Wednesdays).    If you find that they have not been scheduled please call the Orthopaedic Appointment front desk at [phone].  

## 2022-09-08 NOTE — Evaluation (Signed)
Physical Therapy Evaluation Patient Details Name: Ashley Morrow MRN: [id number] DOB: October 12, 1995 Today's Date: 09/08/2022  History of Present Illness  Ashley Morrow is a 27 year old female who underwent left knee patella osteochondral allograft implantation with tibial tubercle osteotomy and chondroplasty done by Dr. Allena Katz on 09/07/2022. She is non weight bearing on the left side.   Clinical Impression  Patient is alert and awake upon arrival and agreeable to PT evaluation. She has concerns about high pain levels. Her pain ranged from 8-9/10 in the L knee during session and she was most limited by pain, worsened with dependent position and by efforts to lift her L LE off the floor during standing and ambulation. She appeared overwhelmed by her pain and required min A from PT to help lift her L LE while ambulating 20 feet with RW from bed to chair. She reported oxycodone is not helping her pain, but does maker her sleepy. Nursing gave her tylenol and a muscle relaxer during the session. She usually lives with her daughter in an apartment with two flights of stairs to enter (one handrail available). Her daughter is staying with her daughter's father for 1-2 months, and patient's friend is staying with her for the next week. Prior to hospitalization, patient was I with all aspects of care and mobility and worked full time. Patient was tearful because she feels she needs to get to a hospital in [place] today where they are planning to take her sister off of life support at 3pm. Patient is currently not functionally ready to return home due to difficulty tolerating short ambulation of 20 feet and not demonstrating the ability to climb stairs needed to return home safely. She will likely progress if pain is better controlled with good potential for being able to complete stairs with assistance. RN and MD notified about difficulties with pain, and PT plans for 2nd visit with her in a few hours, with hopes that pain  can be better controlled and patient will be able to demonstrate safe and effective ability to climb stairs. Patient has experienced a decline in functional independence and would benefit from skilled physical therapy to address impairments and functional limitations (see PT Problem List below) to work towards stated goals and return to PLOF or maximal functional independence.       Recommendations for follow up therapy are one component of a multi-disciplinary discharge planning process, led by the attending physician.  Recommendations may be updated based on patient status, additional functional criteria and insurance authorization.  Follow Up Recommendations       Assistance Recommended at Discharge Intermittent Supervision/Assistance  Patient can return home with the following  A little help with walking and/or transfers;A little help with bathing/dressing/bathroom;Assistance with cooking/housework;Help with stairs or ramp for entrance    Equipment Recommendations Rolling walker (2 wheels);BSC/3in1  Recommendations for Other Services       Functional Status Assessment Patient has had a recent decline in their functional status and demonstrates the ability to make significant improvements in function in a reasonable and predictable amount of time.     Precautions / Restrictions Precautions Precautions: Fall Required Braces or Orthoses: Other Brace Other Brace: hinged knee brace locked at 0 degrees on L knee Restrictions Weight Bearing Restrictions: Yes LLE Weight Bearing: Non weight bearing Other Position/Activity Restrictions: Keep knee fully extended at rest - NO PILLOWS UNDER KNEE.      Mobility  Bed Mobility Overal bed mobility: Modified Independent  General bed mobility comments: supine <> sit mod I for increased time    Transfers Overall transfer level: Needs assistance   Transfers: Sit to/from Stand Sit to Stand: Min guard           General  transfer comment: sit <> stand bed to chair with CGA, increased time, cuing for hand placement    Ambulation/Gait Ambulation/Gait assistance: Min assist Gait Distance (Feet): 20 Feet Assistive device: Rolling walker (2 wheels)   Gait velocity: very slow     General Gait Details: Patient ambulated very slowly with RW with hopping gait and PT holding L LE off the floor (pt with difficulty keeping foot off floor due to pain). Patient appears overwhelmed by pain.  Stairs Stairs:  (patient does not feel she can tolerate the pain)          Wheelchair Mobility    Modified Rankin (Stroke Patients Only)       Balance Overall balance assessment: Needs assistance Sitting-balance support: Bilateral upper extremity supported Sitting balance-Leahy Scale: Good Sitting balance - Comments: steady sitting at edge of bed   Standing balance support: During functional activity, Bilateral upper extremity supported, Reliant on assistive device for balance Standing balance-Leahy Scale: Poor Standing balance comment: Patient is dependent on B UE support on RW while standing.                             Pertinent Vitals/Pain Pain Assessment Pain Assessment: 0-10 Pain Score: 9  Pain Location: anterior left knee Pain Descriptors / Indicators: Sharp Pain Intervention(s): Limited activity within patient's tolerance, Premedicated before session, Monitored during session, Patient requesting pain meds-RN notified, Ice applied, RN gave pain meds during session, Repositioned    Home Living Family/patient expects to be discharged to:: Private residence Living Arrangements: Children;Non-relatives/Friends (daughter will be with her dad for the next two months. Freind staying with her for the first week (available except for going to work).) Available Help at Discharge: Friend(s);Available PRN/intermittently Type of Home: Apartment Home Access: Stairs to enter Entrance Stairs-Rails:  Right;Left (left side on first flight, right side on 2nd flight) Entrance Stairs-Number of Steps: 2 flights   Home Layout: One level Home Equipment: Shower seat;BSC/3in1;Crutches (shower seat and BSC ordered from [employer] to come 09/08/2022.) Additional Comments: Crutches at her parent's house in [place]    Prior Function Prior Level of Function : Independent/Modified Independent;History of Falls (last six months);Working/employed;Driving Engineer, water)             Mobility Comments: Pateint was I with all aspects of mobiltiy and care prior to hospitalization. ADLs Comments: Pateint was I with all aspects of care prior to hospitalization.     Hand Dominance   Dominant Hand: Right    Extremity/Trunk Assessment   Upper Extremity Assessment Upper Extremity Assessment: Overall WFL for tasks assessed    Lower Extremity Assessment Lower Extremity Assessment: Overall WFL for tasks assessed;LLE deficits/detail LLE Deficits / Details: NWB and brace locked at 0 degrees LLE: Unable to fully assess due to pain;Unable to fully assess due to immobilization    Cervical / Trunk Assessment Cervical / Trunk Assessment: Normal  Communication   Communication: No difficulties  Cognition Arousal/Alertness: Awake/alert Behavior During Therapy: WFL for tasks assessed/performed Overall Cognitive Status: Within Functional Limits for tasks assessed  General Comments: pateint states oxycodone she recevied made her feel a little loopy        General Comments      Exercises Other Exercises Other Exercises: educated pateint on techniques for stair navigation, weight bearing restrictions, brace restritions, not putting a pillow behind knee.   Assessment/Plan    PT Assessment Patient needs continued PT services  PT Problem List Decreased strength;Decreased mobility;Decreased safety awareness;Decreased range of motion;Decreased knowledge  of precautions;Decreased activity tolerance;Decreased skin integrity;Pain;Decreased balance;Decreased knowledge of use of DME       PT Treatment Interventions DME instruction;Therapeutic exercise;Gait training;Balance training;Stair training;Neuromuscular re-education;Functional mobility training;Therapeutic activities;Patient/family education    PT Goals (Current goals can be found in the Care Plan section)  Acute Rehab PT Goals Patient Stated Goal: to get to hospital in [place] by 3pm 09/08/2022 when/where they are planning to take her sister of off life support PT Goal Formulation: With patient Time For Goal Achievement: 09/08/22 Potential to Achieve Goals: Poor    Frequency BID     Co-evaluation               AM-PAC PT "6 Clicks" Mobility  Outcome Measure Help needed turning from your back to your side while in a flat bed without using bedrails?: None Help needed moving from lying on your back to sitting on the side of a flat bed without using bedrails?: None Help needed moving to and from a bed to a chair (including a wheelchair)?: A Little Help needed standing up from a chair using your arms (e.g., wheelchair or bedside chair)?: A Little Help needed to walk in hospital room?: A Little Help needed climbing 3-5 steps with a railing? : Total 6 Click Score: 18    End of Session Equipment Utilized During Treatment: Gait belt (L knee hinged brace locked at 0 degrees) Activity Tolerance: Patient limited by pain Patient left: in chair;with call [place]/phone within reach;with family/visitor present (polar care applied) Nurse Communication: Mobility status;Patient requests pain meds PT Visit Diagnosis: Difficulty in walking, not elsewhere classified (R26.2);Pain;Muscle weakness (generalized) (M62.81) Pain - Right/Left: Left Pain - part of body: Knee    Time: 1610-9604 PT Time Calculation (min) (ACUTE ONLY): 52 min   Charges:   PT Evaluation $PT Eval Moderate Complexity: 1  Mod PT Treatments $Gait Training: 8-22 mins        Luretha Murphy. Ilsa Iha, PT, DPT 09/08/22, 10:03 AM

## 2022-09-08 NOTE — TOC Initial Note (Signed)
Transition of Care [facility]) - Initial/Assessment Note    Patient Details  Name: Ashley Morrow MRN: [id number] Date of Birth: 11-Nov-1995  [facility]) CM/SW Contact:    Bridgett Larsson, LCSW Phone Number: 09/08/2022, 4:30 PM  Clinical Narrative:                 CSW met with patient. Multiple supports at bedside, including parents. CSW introduced role and explained that discharge planning would be discussed. Patient endorses limited mobility noting that residence has 2 large cement steps outside of building and two flights of stairs. She will not have support to assist her with navigating steps and does not have another temporary housing option.   Pt is requesting information regarding SNF placement, preferably in [place], [place] CSW discussed process and provided CMS scores for facilities in [place], [place], and [place] (per request) Family will review and inform CSW of facilities she is interested in. They agreed to contact facilities to inquire about insurance coverage, to assist with decision regarding placement. CSW will continue to follow patient for support.    Barriers to Discharge: Continued Medical Work up   Patient Goals and CMS Choice            Expected Discharge Plan and Services       Living arrangements for the past 2 months: Apartment Expected Discharge Date: 09/08/22                                    Prior Living Arrangements/Services Living arrangements for the past 2 months: Apartment Lives with:: Self Patient language and need for interpreter reviewed:: Yes Do you feel safe going back to the place where you live?: Yes      Need for Family Participation in Patient Care: Yes (Comment) Care giver support system in place?: Yes (comment)   Criminal Activity/Legal Involvement Pertinent to Current Situation/Hospitalization: No - Comment as needed  Activities of Daily Living Home Assistive Devices/Equipment: None ADL Screening  (condition at time of admission) Patient's cognitive ability adequate to safely complete daily activities?: Yes Is the patient deaf or have difficulty hearing?: No Does the patient have difficulty seeing, even when wearing glasses/contacts?: No Does the patient have difficulty concentrating, remembering, or making decisions?: No Patient able to express need for assistance with ADLs?: Yes Does the patient have difficulty dressing or bathing?: No Independently performs ADLs?: Yes (appropriate for developmental age) Does the patient have difficulty walking or climbing stairs?: Yes Weakness of Legs: Left Weakness of Arms/Hands: None  Permission Sought/Granted                  Emotional Assessment Appearance:: Appears stated age Attitude/Demeanor/Rapport: Engaged Affect (typically observed): Appropriate Orientation: : Oriented to Self, Oriented to Place, Oriented to  Time, Oriented to Situation Alcohol / Substance Use: Not Applicable Psych Involvement: No (comment)  Admission diagnosis:  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] Patient Active Problem List   Diagnosis Date Noted   Chondral defect of patella 09/07/2022   PCP:  Liborio Nixon, NP Pharmacy:   [facility] [address], [place] - [address] [place] [address] Phone: [phone] Fax: [phone]     Social Determinants of Health (SDOH) Social History: SDOH Screenings   Food Insecurity: No Food Insecurity (09/07/2022)  Housing: Low Risk  (09/07/2022)  Transportation Needs: No Transportation Needs (09/07/2022)  Utilities: Not At Risk (09/07/2022)  Tobacco Use: Low Risk  (09/07/2022)   SDOH Interventions:     Readmission Risk Interventions     No data to display

## 2022-09-08 NOTE — Progress Notes (Signed)
  Subjective: 1 Day Post-Op Procedure(s) (LRB): Left knee arthroscopy with chondroplasty, osteochondroplasty, osteochondral allograft implantation to patella, and anteromedializing tibial tubercle osteotomy (Left) chondroplasty (Left) Patient reports pain as mild.   Patient is well, and has had no acute complaints or problems Plan is to go Home after hospital stay. Negative for chest pain and shortness of breath Fever: no Gastrointestinal: Negative for nausea and vomiting  Objective: Vital signs in last 24 hours: Temp:  [97 F (36.1 C)-98.5 F (36.9 C)] 97.9 F (36.6 C) (05/18 0457) Pulse Rate:  [78-102] 97 (05/18 0457) Resp:  [10-21] 16 (05/18 0457) BP: (99-127)/(60-92) 99/65 (05/18 0457) SpO2:  [96 %-100 %] 96 % (05/18 0457)  Intake/Output from previous day:  Intake/Output Summary (Last 24 hours) at 09/08/2022 0720 Last data filed at 09/08/2022 0505 Gross per 24 hour  Intake 1540 ml  Output 400 ml  Net 1140 ml    Intake/Output this shift: No intake/output data recorded.  Labs: No results for input(s): "HGB" in the last 72 hours. No results for input(s): "WBC", "RBC", "HCT", "PLT" in the last 72 hours. No results for input(s): "NA", "K", "CL", "CO2", "BUN", "CREATININE", "GLUCOSE", "CALCIUM" in the last 72 hours. No results for input(s): "LABPT", "INR" in the last 72 hours.   EXAM General - Patient is Alert and Oriented Extremity - Neurovascular intact Sensation intact distally Dorsiflexion/Plantar flexion intact Dressing/Incision - clean, dry, with the Hemovac removed with no complication.  The brace is locked in extension. Motor Function - intact, moving foot and toes well on exam.   Past Medical History:  Diagnosis Date   Asthma    Elevated serum creatinine    mild   GERD (gastroesophageal reflux disease)    rare   Glaucoma    Pneumonia 2019    Assessment/Plan: 1 Day Post-Op Procedure(s) (LRB): Left knee arthroscopy with chondroplasty,  osteochondroplasty, osteochondral allograft implantation to patella, and anteromedializing tibial tubercle osteotomy (Left) chondroplasty (Left) Principal Problem:   Chondral defect of patella  Estimated body mass index is 39.02 kg/m as calculated from the following:   Height as of this encounter: 5\' 7"  (1.702 m).   Weight as of this encounter: 113 kg. Advance diet Up with therapy D/C IV fluids  Discharge planning: Plan to send the patient home after completing physical therapy goals.  Follow-up at [facility] clinic orthopedics in 2 weeks for dressing change and suture removal.  DVT Prophylaxis - Aspirin Nonweightbearing to left leg for the first 4 weeks.  Dedra Skeens, PA-C Orthopaedic Surgery 09/08/2022, 7:20 AM

## 2022-09-08 NOTE — Progress Notes (Signed)
Physical Therapy Treatment Patient Details Name: Ashley Morrow MRN: [id number] DOB: 19-Jun-1995 Today's Date: 09/08/2022   History of Present Illness Ashley Morrow is a 27 year old female who underwent left knee patella osteochondral allograft implantation with tibial tubercle osteotomy and chondroplasty done by Dr. Allena Katz on 09/07/2022. She is non weight bearing on the left side.    PT Comments    Patient received reclining in chair and agreeable to PT treatment. Mother and father present throughout session and brought axial crutches from home that patient utilized in session. Patient reporting pain lower than last session, 6/10 at left knee at start of session. Patient completed several sit <> stand transfers from/to chair and to bed with RW and SBA. The first stand to sit to the chair, she did not follow PT cuing to reach back for arms of chair or kick the left foot out and flopped into the chair. She reported hitting the back of her left thigh on the edge of the chair and it is unknown if she caught her foot on the floor. She had increased pain at the left distal quad (proximal to patella) following this. Brace remained donned and locked at 0 degrees throughout session including during this transfer. Patient's pain increased to 9/10 by end of session. Patient ambulated 70 feet with RW and 2 steps with axial crutches and SBA-CGA, now able to maintain L LE NWB precautions during ambulation. Patient attempted several ways to navigate stairs with +2 (PT and PTA) assist for safety. She was not able/comfortable to try safely taking the first step to complete stairs backwards with RW or forwards or backwards with one handrail and one axial crutch, or with B axial crutches. She was able to lower and raise her bottom from the 2nd and 3rd step using 1-2 handrails in order bump up/down the steps on her bottom, and she was able to bump up/down a step, but she was safe to try to stand up from the top step with the  room and assistance available. Discharging home continues to be not recommended by PT due to her inability to safely complete the two flights of stairs in/out of her apartment. If she were to go home, PT recommends seated on her butt as the safest method, but she will require total A +3 (two people to lift her body and one person to keep her L LE off the ground) to stand/sit at the top of the steps. Patient returned to bed at end of session, requiring min A for supine to sit transfer to help move L LE onto the bed. MD/RN notified of increased pain following first stand to sit transfer. Patient would benefit from skilled physical therapy to address impairments and functional limitations (see PT Problem List below) to work towards stated goals and return to PLOF or maximal functional independence.     Recommendations for follow up therapy are one component of a multi-disciplinary discharge planning process, led by the attending physician.  Recommendations may be updated based on patient status, additional functional criteria and insurance authorization.  Follow Up Recommendations  Can patient physically be transported by private vehicle: Yes    Assistance Recommended at Discharge Intermittent Supervision/Assistance  Patient can return home with the following A little help with walking and/or transfers;A little help with bathing/dressing/bathroom;Assistance with cooking/housework;Help with stairs or ramp for entrance (Total A +3 to navigate stairs for entrance/exit)   Equipment Recommendations  Rolling walker (2 wheels);BSC/3in1;Wheelchair (measurements PT)    Recommendations  for Other Services       Precautions / Restrictions Precautions Precautions: Fall Required Braces or Orthoses: Other Brace Other Brace: hinged knee brace locked at 0 degrees on L knee Restrictions Weight Bearing Restrictions: Yes LLE Weight Bearing: Non weight bearing Other Position/Activity Restrictions: Per Dr. Allena Katz:  Keep knee fully extended at rest - NO PILLOWS UNDER KNEE.     Mobility  Bed Mobility Overal bed mobility: Needs Assistance Bed Mobility: Sit to Supine       Sit to supine: Min assist   General bed mobility comments: min A at L LE to slide onto bed    Transfers Overall transfer level: Needs assistance Equipment used: Rolling walker (2 wheels) Transfers: Sit to/from Stand Sit to Stand: Supervision           General transfer comment: Patient completed several sit <> stand transfers at the chair and to the bed to RW with supervision. The first stand to sit to the chair, she did not follow PT cuing to reach back for arms of chair or kick the left foot out and flopped into the chair. She reported hitting the back of her left thigh on the edge of the chair and it is unknown if she caught her foot on the floor. She had increased pain at the left distal quad (proximal to patella) following this. Brace remained donned and locked at 0 degrees.    Ambulation/Gait Ambulation/Gait assistance: Supervision, Min guard Gait Distance (Feet): 50 Feet Assistive device: Rolling walker (2 wheels), Crutches   Gait velocity: decreased     General Gait Details: Patient ambulated slowly with RW with SBA with hopping gait pattern while mantaining NWB status to L LE. Pateint now able to keep foot off the floor without PT assist. Pateint ambulated approx 2 feet with axial crutches and CGA with hopping gait pattern while mantaining NWB to L LE.   Stairs Stairs: Yes Stairs assistance: +2 safety/equipment, +2 physical assistance, Mod assist   Number of Stairs: 3 General stair comments: Patient attempted several ways to navigate stairs with +2 (PT and PTA) assist for safety. She was not able/comfortable to try safely taking the first step to complete stairs backwards with RW or forwards or backwards with one handrail and one axial crutch, or with B axial crutches. She was able to lower and raise her bottom  from the 2nd and 3rd step using 1-2 handrails in order bump up/down the steps on her bottom, and she was able to bump up/down a step, but she was safe to try to stand up from the top step with the room and assistance available.   Wheelchair Mobility    Modified Rankin (Stroke Patients Only)       Balance Overall balance assessment: Needs assistance Sitting-balance support: Bilateral upper extremity supported Sitting balance-Leahy Scale: Good Sitting balance - Comments: steady sitting at edge of bed   Standing balance support: During functional activity, Bilateral upper extremity supported, Reliant on assistive device for balance Standing balance-Leahy Scale: Fair Standing balance comment: Patient is dependent on B UE support for balance while standing. Can breifly take one hand off at a time.                            Cognition Arousal/Alertness: Awake/alert Behavior During Therapy: WFL for tasks assessed/performed Overall Cognitive Status: Within Functional Limits for tasks assessed  General Comments: pateint states oxycodone she recevied made her feel a little loopy        Exercises Other Exercises Other Exercises: educated patient on techniques for stair navigation, weight bearing restrictions, safe and effective transfer techniques, discharge safety    General Comments        Pertinent Vitals/Pain Pain Assessment Pain Assessment: 0-10 Pain Score: 9  Pain Location: anterior left knee at distal quads, proximal to patella Pain Descriptors / Indicators: Crying, Guarding, Grimacing Pain Intervention(s): Limited activity within patient's tolerance, Monitored during session, Premedicated before session, Repositioned, Ice applied    Home Living                          Prior Function            PT Goals (current goals can now be found in the care plan section) Acute Rehab PT Goals Patient Stated  Goal: to get to hospital in [place] by 3pm 09/08/2022 when/where they are planning to take her sister of off life support PT Goal Formulation: With patient Time For Goal Achievement: 09/08/22 Potential to Achieve Goals: Poor Progress towards PT goals: Not progressing toward goals - comment (unable to safely discharge home due to inability to safely navigate stairs)    Frequency    BID      PT Plan Discharge plan needs to be updated    Co-evaluation              AM-PAC PT "6 Clicks" Mobility   Outcome Measure  Help needed turning from your back to your side while in a flat bed without using bedrails?: None Help needed moving from lying on your back to sitting on the side of a flat bed without using bedrails?: None Help needed moving to and from a bed to a chair (including a wheelchair)?: A Little Help needed standing up from a chair using your arms (e.g., wheelchair or bedside chair)?: A Little Help needed to walk in hospital room?: A Little Help needed climbing 3-5 steps with a railing? : Total 6 Click Score: 18    End of Session Equipment Utilized During Treatment: Gait belt (L knee hinged brace locked at 0 degrees) Activity Tolerance: Patient limited by pain;Patient limited by fatigue Patient left: with call [place]/phone within reach;with family/visitor present;in bed (polar care applied) Nurse Communication: Mobility status PT Visit Diagnosis: Difficulty in walking, not elsewhere classified (R26.2);Pain;Muscle weakness (generalized) (M62.81) Pain - Right/Left: Left Pain - part of body: Knee     Time: 1204-1259 PT Time Calculation (min) (ACUTE ONLY): 55 min  Charges:  $Gait Training: 23-37 mins $Therapeutic Activity: 23-37 mins                     Luretha Murphy. Ilsa Iha, PT, DPT 09/08/22, 1:48 PM

## 2022-09-08 NOTE — Evaluation (Signed)
Occupational Therapy Evaluation Patient Details Name: Ashley Morrow MRN: [id number] DOB: Sep 19, 1995 Today's Date: 09/08/2022   History of Present Illness Ashley Morrow is a 27 year old female who underwent left knee patella osteochondral allograft implantation with tibial tubercle osteotomy and chondroplasty done by Dr. Allena Katz on 09/07/2022. She is non weight bearing on the left side.   Clinical Impression   Ashley Morrow was seen for OT evaluation this date. Prior to hospital admission, pt was IND. Pt lives in 3rd story apartment. Pt currently requires SBA + RW BSC t/f and pericare standing, utilizes TDWB for pericare standing, will require wider BSC to complete pericare with lateral leans. MOD I don/doff B socks in long sitting. Anticipate +1 assist to don/doff brace. Extensive education on car transfer, tall bed transfer, tub/BSC t/f, and adapted dressing. Pt would benefit from skilled OT to address noted impairments and functional limitations (see below for any additional details). Upon hospital discharge, recommend assist for stairs, car transfers, tub transfer, and IADLs.   Recommendations for follow up therapy are one component of a multi-disciplinary discharge planning process, led by the attending physician.  Recommendations may be updated based on patient status, additional functional criteria and insurance authorization.   Assistance Recommended at Discharge Intermittent Supervision/Assistance  Patient can return home with the following A little help with walking and/or transfers;A little help with bathing/dressing/bathroom;Help with stairs or ramp for entrance    Functional Status Assessment  Patient has had a recent decline in their functional status and demonstrates the ability to make significant improvements in function in a reasonable and predictable amount of time.  Equipment Recommendations  BSC/3in1    Recommendations for Other Services       Precautions / Restrictions  Precautions Precautions: Fall Required Braces or Orthoses: Other Brace Other Brace: hinged knee brace locked at 0 degrees on L knee Restrictions Weight Bearing Restrictions: Yes LLE Weight Bearing: Non weight bearing Other Position/Activity Restrictions: Per Dr. Allena Katz: Keep knee fully extended at rest - NO PILLOWS UNDER KNEE.      Mobility Bed Mobility               General bed mobility comments: not tested    Transfers Overall transfer level: Needs assistance Equipment used: Rolling walker (2 wheels) Transfers: Sit to/from Stand, Bed to chair/wheelchair/BSC Sit to Stand: Supervision   Squat pivot transfers: Supervision Step pivot transfers: Supervision     General transfer comment: chair>BSC using RW. BSC>chair wuth squat pivot t/f      Balance Overall balance assessment: Needs assistance Sitting-balance support: Bilateral upper extremity supported Sitting balance-Leahy Scale: Good     Standing balance support: Single extremity supported Standing balance-Leahy Scale: Fair                             ADL either performed or assessed with clinical judgement   ADL Overall ADL's : Needs assistance/impaired                                       General ADL Comments: SBA + RW BSC t/f and pericare standing, utilizes TDWB for pericare standing, will require wider BSC to complete pericare with lateral leans. MOD I don/doff B socks in long sitting. Anticipate +1 assist to don/doff brace      Pertinent Vitals/Pain Pain Assessment Pain Assessment: 0-10 Pain Score: 6  Pain Location: L knee Pain Descriptors / Indicators: Guarding, Grimacing Pain Intervention(s): Limited activity within patient's tolerance, Premedicated before session, RN gave pain meds during session     Hand Dominance Right   Extremity/Trunk Assessment Upper Extremity Assessment Upper Extremity Assessment: Overall WFL for tasks assessed   Lower Extremity  Assessment Lower Extremity Assessment: LLE deficits/detail LLE Deficits / Details: NWB and brace locked at 0 degrees LLE: Unable to fully assess due to pain;Unable to fully assess due to immobilization       Communication Communication Communication: No difficulties   Cognition Arousal/Alertness: Awake/alert Behavior During Therapy: WFL for tasks assessed/performed Overall Cognitive Status: Within Functional Limits for tasks assessed                                                  Home Living Family/patient expects to be discharged to:: Private residence Living Arrangements: Non-relatives/Friends Available Help at Discharge: Friend(s);Available PRN/intermittently Type of Home: Apartment Home Access: Stairs to enter Entrance Stairs-Number of Steps: 2 flights Entrance Stairs-Rails: Right;Left Home Layout: One level     Bathroom Shower/Tub: Chief Strategy Officer: Standard     Home Equipment: Shower seat;BSC/3in1;Crutches          Prior Functioning/Environment Prior Level of Function : Independent/Modified Independent;History of Falls (last six months);Working/employed;Driving                        OT Problem List: Decreased strength;Decreased activity tolerance;Pain      OT Treatment/Interventions: Self-care/ADL training;Therapeutic exercise;Energy conservation;DME and/or AE instruction;Therapeutic activities;Balance training    OT Goals(Current goals can be found in the care plan section) Acute Rehab OT Goals Patient Stated Goal: to go home OT Goal Formulation: With patient/family Time For Goal Achievement: 09/22/22 Potential to Achieve Goals: Good ADL Goals Pt Will Perform Lower Body Dressing: with modified independence;sit to/from stand Pt Will Transfer to Toilet: with modified independence;stand pivot transfer;bedside commode Pt Will Perform Toileting - Clothing Manipulation and hygiene: with modified independence;sit  to/from stand  OT Frequency: Min 2X/week    Co-evaluation              AM-PAC OT "6 Clicks" Daily Activity     Outcome Measure Help from another person eating meals?: None Help from another person taking care of personal grooming?: None Help from another person toileting, which includes using toliet, bedpan, or urinal?: A Little Help from another person bathing (including washing, rinsing, drying)?: A Little Help from another person to put on and taking off regular upper body clothing?: None Help from another person to put on and taking off regular lower body clothing?: A Little 6 Click Score: 21   End of Session    Activity Tolerance: Patient tolerated treatment well Patient left: in chair;with call [place]/phone within reach;with family/visitor present  OT Visit Diagnosis: Other abnormalities of gait and mobility (R26.89);Muscle weakness (generalized) (M62.81)                Time: 1100-1141 OT Time Calculation (min): 41 min Charges:  OT General Charges $OT Visit: 1 Visit OT Evaluation $OT Eval Low Complexity: 1 Low OT Treatments $Self Care/Home Management : 23-37 mins  Kathie Dike, M.S. OTR/L  09/08/22, 1:55 PM  ascom [phone]

## 2022-09-08 NOTE — Discharge Summary (Cosign Needed Addendum)
Physician Discharge Summary  Subjective: 3 Days Post-Op Procedure(s) (LRB): Left knee arthroscopy with chondroplasty, osteochondroplasty, osteochondral allograft implantation to patella, and anteromedializing tibial tubercle osteotomy (Left) chondroplasty (Left) Patient reports pain as mild.   Patient seen in rounds with Dr. Ernest Pine. Patient is well, and has had no acute complaints or problems Patient is ready to go home with outpatient physical therapy  Physician Discharge Summary  Patient ID: Ashley Morrow MRN: [id number] DOB/AGE: 02/15/1996 26 y.o.  Admit date: 09/07/2022 Discharge date: 09/10/2022  Admission Diagnoses:  Discharge Diagnoses:  Principal Problem:   Chondral defect of patella   Discharged Condition: fair  Hospital Course: The patient is postop day 2 from a left knee patella osteochondral allograft implantation with tibial tubercle osteotomy and chondroplasty done by Dr. Allena Katz.  She is doing well since surgery.  Her nerve block is wearing off.  Her drain was removed yesterday morning.  She is ambulated to the bedside commode.  She did 70 feet with PT.  She is planning on going home this afternoon.  Her vitals have remained stable.  Treatments: surgery:  1.  Left knee diagnostic arthroscopy 2. Left knee patella osteochondral allograft (20mm x 1) 3. Left knee tibial tubercle osteotomy   SURGEON:  Rosealee Albee, MD   ASSISTANT(S):  Sonny Dandy, PA   ANESTHESIA: Gen + postoperative regional anesthesia   TOTAL IV FLUIDS: see anesthesia record   ESTIMATED BLOOD LOSS: 50cc   TOURNIQUET TIME: 108 minutes.   DRAINS: None   SPECIMENS: None.   IMPLANTS: MTF patella osteochondral allograft, 20mm  Discharge Exam: Blood pressure 104/80, pulse 93, temperature (!) 97.5 F (36.4 C), temperature source Oral, resp. rate 17, height 5\' 7"  (1.702 m), weight 113 kg, last menstrual period 08/21/2022, SpO2 98 %.   Disposition: Discharge disposition: 01-Home or Self  Care        Allergies as of 09/10/2022       Reactions   Honey Anaphylaxis, Swelling   Avocado Other (See Comments)   "Makes my stomach hurt"        Medication List     TAKE these medications    albuterol 108 (90 Base) MCG/ACT inhaler Commonly known as: VENTOLIN HFA Inhale 2 puffs into the lungs every 4 (four) hours as needed.   APPLE CIDER VINEGAR PO Take 1 tablet by mouth daily.   aspirin EC 325 MG tablet Take 1 tablet (325 mg total) by mouth daily.   BREO ELLIPTA IN Inhale 1 puff into the lungs every morning.   cetirizine 10 MG tablet Commonly known as: ZYRTEC Take 10 mg by mouth every morning.   fluticasone 50 MCG/ACT nasal spray Commonly known as: FLONASE Place 2 sprays into both nostrils daily.   Implanon 68 MG Impl implant Generic drug: etonogestrel 1 each by Subdermal route once.   methocarbamol 500 MG tablet Commonly known as: ROBAXIN Take 1 tablet (500 mg total) by mouth every 6 (six) hours as needed for muscle spasms.   multivitamin tablet Take 1 tablet by mouth daily.   ondansetron 4 MG tablet Commonly known as: ZOFRAN Take 1 tablet (4 mg total) by mouth every 6 (six) hours as needed for nausea.   OVER THE COUNTER MEDICATION Take 1 tablet by mouth daily at 6 (six) AM. Probiotic Vaginal Health   oxyCODONE 5 MG immediate release tablet Commonly known as: Oxy IR/ROXICODONE Take 1-2 tablets (5-10 mg total) by mouth every 4 (four) hours as needed for moderate pain (pain score 4-6).  phentermine 37.5 MG tablet Commonly known as: ADIPEX-P Take 37.5 mg by mouth daily before breakfast.   tiotropium 18 MCG inhalation capsule Commonly known as: SPIRIVA Place 18 mcg into inhaler and inhale every morning.   zafirlukast 10 MG tablet Commonly known as: ACCOLATE Take 10 mg by mouth every morning.               Durable Medical Equipment  (From admission, onward)           Start     Ordered   09/09/22 0934  For home use only DME  standard manual wheelchair with seat cushion  Once       Comments: Patient suffers from Left leg surgery which impairs their ability to perform daily activities like toileting, bathing and grooming in the home.  A walker will not resolve issue with performing activities of daily living. A wheelchair will allow patient to safely perform daily activities. Patient can safely propel the wheelchair in the home or has a caregiver who can provide assistance. Length of need TBD  Accessories: elevating leg rests (ELRs), wheel locks, extensions and anti-tippers.  With elevate leg rest left leg   09/09/22 0936   09/08/22 1007  For home use only DME 3 n 1  Once       Comments: Patient is not able to walk the distance required to go to the bathroom without assistance. A 3in1 BSC will alleviate this problem.   09/08/22 1007   09/08/22 1006  For home use only DME Walker rolling  Once       Question Answer Comment  Walker: With 5 Inch Wheels   Patient needs a walker to treat with the following condition Difficulty in walking, not elsewhere classified      09/08/22 1005            Follow-up Information     Signa Kell, MD. Go in 2 week(s).   Specialty: Orthopedic Surgery Why: For staple removal and wound care Contact information: [address] [place] [address] [phone]                 Signed: Lenard Forth, Barett Whidbee 09/10/2022, 6:48 AM   Objective: Vital signs in last 24 hours: Temp:  [97.5 F (36.4 C)-98.2 F (36.8 C)] 97.5 F (36.4 C) (05/19 2303) Pulse Rate:  [92-93] 93 (05/19 2303) Resp:  [17-20] 17 (05/19 2303) BP: (104-117)/(80-81) 104/80 (05/19 2303) SpO2:  [98 %] 98 % (05/19 2303)  Intake/Output from previous day:  Intake/Output Summary (Last 24 hours) at 09/10/2022 0648 Last data filed at 09/09/2022 1600 Gross per 24 hour  Intake 720 ml  Output --  Net 720 ml    Intake/Output this shift: No intake/output data recorded.  Labs: No results for input(s):  "HGB" in the last 72 hours. No results for input(s): "WBC", "RBC", "HCT", "PLT" in the last 72 hours. No results for input(s): "NA", "K", "CL", "CO2", "BUN", "CREATININE", "GLUCOSE", "CALCIUM" in the last 72 hours. No results for input(s): "LABPT", "INR" in the last 72 hours.  EXAM: General - Patient is Alert and Oriented Extremity - Neurovascular intact Sensation intact distally Dorsiflexion/Plantar flexion intact Incision - clean, dry, with the Hemovac removed with no complication. Motor Function -plantarflexion and dorsiflexion intact.  Assessment/Plan: 3 Days Post-Op Procedure(s) (LRB): Left knee arthroscopy with chondroplasty, osteochondroplasty, osteochondral allograft implantation to patella, and anteromedializing tibial tubercle osteotomy (Left) chondroplasty (Left) Procedure(s) (LRB): Left knee arthroscopy with chondroplasty, osteochondroplasty, osteochondral allograft implantation to patella, and  anteromedializing tibial tubercle osteotomy (Left) chondroplasty (Left) Past Medical History:  Diagnosis Date   Asthma    Elevated serum creatinine    mild   GERD (gastroesophageal reflux disease)    rare   Glaucoma    Pneumonia 2019   Principal Problem:   Chondral defect of patella  Estimated body mass index is 39.02 kg/m as calculated from the following:   Height as of this encounter: 5\' 7"  (1.702 m).   Weight as of this encounter: 113 kg. Advance diet Up with therapy D/C IV fluids Diet - Regular diet Follow up - in 2 weeks Activity - NWB in your brace locked in extension Disposition - Home Condition Upon Discharge - Stable DVT Prophylaxis - Aspirin  Dedra Skeens, PA-C Orthopaedic Surgery 09/10/2022, 6:48 AM

## 2022-09-09 ENCOUNTER — Encounter: Payer: Self-pay | Admitting: Orthopedic Surgery

## 2022-09-09 DIAGNOSIS — M222X2 Patellofemoral disorders, left knee: Secondary | ICD-10-CM | POA: Diagnosis not present

## 2022-09-09 MED ORDER — FLUCONAZOLE 50 MG PO TABS
150.0000 mg | ORAL_TABLET | Freq: Once | ORAL | Status: AC
  Administered 2022-09-09: 150 mg via ORAL
  Filled 2022-09-09: qty 1

## 2022-09-09 NOTE — Progress Notes (Signed)
Physical Therapy Treatment Patient Details Name: Ashley Morrow MRN: [id number] DOB: 04-23-1996 Today's Date: 09/09/2022   History of Present Illness Ashley Morrow is a 27 year old female who underwent left knee patella osteochondral allograft implantation with tibial tubercle osteotomy and chondroplasty done by Dr. Allena Katz on 09/07/2022. She is non weight bearing on the left side.    PT Comments    In and OOB with min a x 1.  Walks to/from bathroom with RW and NWB with min guard.  Struggles with functional mobility for household distances.  Verbal review of stairs and ways to get on/off floor.  She stated she feels comfortable with verbal review and declined trying so at this time.  Pt wanting to get to [facility] by 3:00 to see her sister and opts to return home.  Equipment needed is RW, BSC and wheelchair with elevating leg rests.   Patient suffers recent NWB knee surgery which impairs his/her ability to perform daily activities like toileting, feeding, dressing, grooming, bathing in the home. A cane, walker, crutch will not resolve the patient's issue with performing activities of daily living. A lightweight wheelchair and cushion is required/recommended and will allow patient to safely perform daily activities.   Patient can safely propel the wheelchair in the home or has a caregiver who can provide assistance.   Pt given gait belt to assist with seated transfers.   Recommendations for follow up therapy are one component of a multi-disciplinary discharge planning process, led by the attending physician.  Recommendations may be updated based on patient status, additional functional criteria and insurance authorization.  Follow Up Recommendations  Can patient physically be transported by private vehicle: Yes    Assistance Recommended at Discharge Intermittent Supervision/Assistance  Patient can return home with the following A little help with walking and/or transfers;A little help with  bathing/dressing/bathroom;Assistance with cooking/housework;Help with stairs or ramp for entrance (Total A +3 to navigate stairs for entrance/exit)   Equipment Recommendations  Rolling walker (2 wheels);BSC/3in1;Wheelchair (measurements PT)    Recommendations for Other Services       Precautions / Restrictions Precautions Precautions: Fall Required Braces or Orthoses: Other Brace Other Brace: hinged knee brace locked at 0 degrees on L knee Restrictions Weight Bearing Restrictions: Yes LLE Weight Bearing: Non weight bearing Other Position/Activity Restrictions: Per Dr. Allena Katz: Keep knee fully extended at rest - NO PILLOWS UNDER KNEE.     Mobility  Bed Mobility Overal bed mobility: Needs Assistance Bed Mobility: Supine to Sit, Sit to Supine     Supine to sit: Min assist Sit to supine: Min assist     Patient Response: Cooperative  Transfers Overall transfer level: Needs assistance Equipment used: Rolling walker (2 wheels) Transfers: Sit to/from Stand Sit to Stand: Min guard                Ambulation/Gait Ambulation/Gait assistance: Supervision, Min guard Gait Distance (Feet): 25 Feet Assistive device: Rolling walker (2 wheels) Gait Pattern/deviations: Step-to pattern Gait velocity: decreased     General Gait Details: 25' x 2 to and from bathroom   Stairs         General stair comments: verbal review stated she feels comfrotable with seated method   Wheelchair Mobility    Modified Rankin (Stroke Patients Only)       Balance Overall balance assessment: Needs assistance Sitting-balance support: Bilateral upper extremity supported Sitting balance-Leahy Scale: Good     Standing balance support: Single extremity supported Standing balance-Leahy Scale: Fair  Cognition Arousal/Alertness: Awake/alert Behavior During Therapy: WFL for tasks assessed/performed Overall Cognitive Status: Within Functional Limits  for tasks assessed                                          Exercises      General Comments        Pertinent Vitals/Pain Pain Assessment Pain Assessment: 0-10 Faces Pain Scale: Hurts whole lot Pain Location: L knee Pain Descriptors / Indicators: Guarding, Grimacing Pain Intervention(s): Limited activity within patient's tolerance, Monitored during session, Patient requesting pain meds-RN notified    Home Living                          Prior Function            PT Goals (current goals can now be found in the care plan section) Progress towards PT goals: Progressing toward goals    Frequency    BID      PT Plan Discharge plan needs to be updated    Co-evaluation              AM-PAC PT "6 Clicks" Mobility   Outcome Measure  Help needed turning from your back to your side while in a flat bed without using bedrails?: None Help needed moving from lying on your back to sitting on the side of a flat bed without using bedrails?: None Help needed moving to and from a bed to a chair (including a wheelchair)?: A Little Help needed standing up from a chair using your arms (e.g., wheelchair or bedside chair)?: A Little Help needed to walk in hospital room?: A Little Help needed climbing 3-5 steps with a railing? : Total 6 Click Score: 18    End of Session Equipment Utilized During Treatment: Gait belt (L knee hinged brace locked at 0 degrees) Activity Tolerance: Patient limited by pain;Patient limited by fatigue Patient left: with call Yarde/phone within reach;with family/visitor present;in bed (polar care applied) Nurse Communication: Mobility status PT Visit Diagnosis: Difficulty in walking, not elsewhere classified (R26.2);Pain;Muscle weakness (generalized) (M62.81) Pain - Right/Left: Left Pain - part of body: Knee     Time: 0820-0915 PT Time Calculation (min) (ACUTE ONLY): 55 min  Charges:  $Gait Training: 53-67 mins                    Danielle Dess, PTA 09/09/22, 9:23 AM

## 2022-09-09 NOTE — Plan of Care (Signed)
  Problem: Education: Goal: Knowledge of General Education information will improve Description: Including pain rating scale, medication(s)/side effects and non-pharmacologic comfort measures 09/09/2022 1935 by Roosevelt Locks, RN Outcome: Progressing 09/09/2022 1935 by Roosevelt Locks, RN Outcome: Progressing   Problem: Health Behavior/Discharge Planning: Goal: Ability to manage health-related needs will improve 09/09/2022 1935 by Roosevelt Locks, RN Outcome: Progressing 09/09/2022 1935 by Roosevelt Locks, RN Outcome: Progressing   Problem: Clinical Measurements: Goal: Ability to maintain clinical measurements within normal limits will improve 09/09/2022 1935 by Roosevelt Locks, RN Outcome: Progressing 09/09/2022 1935 by Roosevelt Locks, RN Outcome: Progressing Goal: Will remain free from infection 09/09/2022 1935 by Roosevelt Locks, RN Outcome: Progressing 09/09/2022 1935 by Roosevelt Locks, RN Outcome: Progressing Goal: Diagnostic test results will improve 09/09/2022 1935 by Roosevelt Locks, RN Outcome: Progressing 09/09/2022 1935 by Roosevelt Locks, RN Outcome: Progressing Goal: Respiratory complications will improve 09/09/2022 1935 by Roosevelt Locks, RN Outcome: Progressing 09/09/2022 1935 by Roosevelt Locks, RN Outcome: Progressing Goal: Cardiovascular complication will be avoided 09/09/2022 1935 by Roosevelt Locks, RN Outcome: Progressing 09/09/2022 1935 by Roosevelt Locks, RN Outcome: Progressing   Problem: Activity: Goal: Risk for activity intolerance will decrease 09/09/2022 1935 by Roosevelt Locks, RN Outcome: Progressing 09/09/2022 1935 by Roosevelt Locks, RN Outcome: Progressing   Problem: Nutrition: Goal: Adequate nutrition will be maintained 09/09/2022 1935 by Roosevelt Locks, RN Outcome: Progressing 09/09/2022 1935 by Roosevelt Locks, RN Outcome: Progressing   Problem: Coping: Goal: Level of anxiety will decrease 09/09/2022 1935 by Roosevelt Locks, RN Outcome:  Progressing 09/09/2022 1935 by Roosevelt Locks, RN Outcome: Progressing   Problem: Elimination: Goal: Will not experience complications related to bowel motility 09/09/2022 1935 by Roosevelt Locks, RN Outcome: Progressing 09/09/2022 1935 by Roosevelt Locks, RN Outcome: Progressing Goal: Will not experience complications related to urinary retention 09/09/2022 1935 by Roosevelt Locks, RN Outcome: Progressing 09/09/2022 1935 by Roosevelt Locks, RN Outcome: Progressing   Problem: Pain Managment: Goal: General experience of comfort will improve 09/09/2022 1935 by Roosevelt Locks, RN Outcome: Progressing 09/09/2022 1935 by Roosevelt Locks, RN Outcome: Progressing   Problem: Safety: Goal: Ability to remain free from injury will improve 09/09/2022 1935 by Roosevelt Locks, RN Outcome: Progressing 09/09/2022 1935 by Roosevelt Locks, RN Outcome: Progressing   Problem: Skin Integrity: Goal: Risk for impaired skin integrity will decrease 09/09/2022 1935 by Roosevelt Locks, RN Outcome: Progressing 09/09/2022 1935 by Roosevelt Locks, RN Outcome: Progressing

## 2022-09-09 NOTE — Progress Notes (Signed)
  Subjective: 2 Days Post-Op Procedure(s) (LRB): Left knee arthroscopy with chondroplasty, osteochondroplasty, osteochondral allograft implantation to patella, and anteromedializing tibial tubercle osteotomy (Left) chondroplasty (Left) Patient reports pain as moderate.   Patient is well, and has had no acute complaints or problems Plan is to go Home after hospital stay. Negative for chest pain and shortness of breath Fever: no Gastrointestinal: Negative for nausea and vomiting  Objective: Vital signs in last 24 hours: Temp:  [97.8 F (36.6 C)-98 F (36.7 C)] 98 F (36.7 C) (05/18 2221) Pulse Rate:  [92-95] 95 (05/18 2221) Resp:  [16-17] 17 (05/18 2221) BP: (112-116)/(65-73) 112/65 (05/18 2221) SpO2:  [100 %] 100 % (05/18 2221)  Intake/Output from previous day:  Intake/Output Summary (Last 24 hours) at 09/09/2022 0657 Last data filed at 09/08/2022 1800 Gross per 24 hour  Intake 600 ml  Output --  Net 600 ml    Intake/Output this shift: No intake/output data recorded.  Labs: No results for input(s): "HGB" in the last 72 hours. No results for input(s): "WBC", "RBC", "HCT", "PLT" in the last 72 hours. No results for input(s): "NA", "K", "CL", "CO2", "BUN", "CREATININE", "GLUCOSE", "CALCIUM" in the last 72 hours. No results for input(s): "LABPT", "INR" in the last 72 hours.   EXAM General - Patient is Alert and Oriented Extremity - Neurovascular intact Sensation intact distally Dorsiflexion/Plantar flexion intact Dressing/Incision - clean, dry, with the Hemovac removed with no complication.  The brace is locked in extension. Motor Function - intact, moving foot and toes well on exam.   Past Medical History:  Diagnosis Date   Asthma    Elevated serum creatinine    mild   GERD (gastroesophageal reflux disease)    rare   Glaucoma    Pneumonia 2019    Assessment/Plan: 2 Days Post-Op Procedure(s) (LRB): Left knee arthroscopy with chondroplasty, osteochondroplasty,  osteochondral allograft implantation to patella, and anteromedializing tibial tubercle osteotomy (Left) chondroplasty (Left) Principal Problem:   Chondral defect of patella  Estimated body mass index is 39.02 kg/m as calculated from the following:   Height as of this encounter: 5\' 7"  (1.702 m).   Weight as of this encounter: 113 kg. Advance diet Up with therapy D/C IV fluids  Discharge planning: Plan to send the patient home after completing physical therapy goals.  Follow-up at [facility] clinic orthopedics in 2 weeks for dressing change and suture removal.  DVT Prophylaxis - Aspirin Nonweightbearing to left leg for the first 4 weeks.  Dedra Skeens, PA-C Orthopaedic Surgery 09/09/2022, 6:57 AM

## 2022-09-09 NOTE — TOC Transition Note (Signed)
Transition of Care [facility]) - CM/SW Discharge Note   Patient Details  Name: Ashley Morrow MRN: [id number] Date of Birth: 01/13/1996  Transition of Care [facility] CM/SW Contact:  Bridgett Larsson, LCSW Phone Number: 09/09/2022, 10:32 AM   Clinical Narrative:    CSW collaborated with PT, who has modified recommendation to assist pt with request to d/c today due to a family emergency. CSW provided update to providers and submitted requested documentation to assist with DME needs, which will be delivered to room. Patient updated with progress. States she receives support from family and friends to assist with transportation needs  10:40 AM CSW was informed by patient that she is not discharging today. Requested a return call at later time   Final next level of care: Home/Self Care Barriers to Discharge: Barriers Resolved   Patient Goals and CMS Choice      Discharge Placement                         Discharge Plan and Services Additional resources added to the After Visit Summary for                  DME Arranged: 3-N-1, Wheelchair manual DME Agency: [employer] Date DME Agency Contacted: 09/09/22 Time DME Agency Contacted: 0900 Representative spoke with at DME Agency: Leavy Cella            Social Determinants of Health (SDOH) Interventions SDOH Screenings   Food Insecurity: No Food Insecurity (09/07/2022)  Housing: Low Risk  (09/07/2022)  Transportation Needs: No Transportation Needs (09/07/2022)  Utilities: Not At Risk (09/07/2022)  Tobacco Use: Low Risk  (09/07/2022)     Readmission Risk Interventions     No data to display

## 2022-09-09 NOTE — Progress Notes (Addendum)
Patient is not able to walk the distance required to go the bathroom, or he/she is unable to safely negotiate stairs required to access the bathroom.  A BSC will alleviate this problem. 

## 2022-09-09 NOTE — Progress Notes (Addendum)
Transition of Care [facility]) - Progression Note    Patient Details  Name: Ashley Morrow MRN: [id number] Date of Birth: January 21, 1996  Transition of Care [facility]) CM/SW Contact  Bridgett Larsson, [place] Phone Number: 09/09/2022, 9:35 AM  Clinical Narrative:        Durable Medical Equipment  (From admission, onward)           Start     Ordered   09/09/22 0934  For home use only DME standard manual wheelchair with seat cushion  Once       Comments: Patient suffers from Left leg surgery which impairs their ability to perform daily activities like toileting, bathing and grooming in the home.  A walker will not resolve issue with performing activities of daily living. A wheelchair will allow patient to safely perform daily activities. Patient can safely propel the wheelchair in the home or has a caregiver who can provide assistance. Length of need TBD  Accessories: elevating leg rests (ELRs), wheel locks, extensions and anti-tippers.  With elevate leg rest left leg   09/09/22 0936   09/08/22 1007  For home use only DME 3 n 1  Once       Comments: Patient is not able to walk the distance required to go to the bathroom without assistance. A 3in1 BSC will alleviate this problem.   09/08/22 1007   09/08/22 1006  For home use only DME Walker rolling  Once       Question Answer Comment  Walker: With 5 Inch Wheels   Patient needs a walker to treat with the following condition Difficulty in walking, not elsewhere classified      09/08/22 1005                Barriers to Discharge: Continued Medical Work up  Expected Discharge Plan and Services       Living arrangements for the past 2 months: Apartment Expected Discharge Date: 09/09/22                                     Social Determinants of Health (SDOH) Interventions SDOH Screenings   Food Insecurity: No Food Insecurity (09/07/2022)  Housing: Low Risk  (09/07/2022)  Transportation Needs: No Transportation Needs  (09/07/2022)  Utilities: Not At Risk (09/07/2022)  Tobacco Use: Low Risk  (09/07/2022)    Readmission Risk Interventions     No data to display

## 2022-09-10 DIAGNOSIS — M222X2 Patellofemoral disorders, left knee: Secondary | ICD-10-CM | POA: Diagnosis not present

## 2022-09-10 NOTE — TOC Progression Note (Signed)
Transition of Care [facility]) - Progression Note    Patient Details  Name: CORALENE TODD MRN: [id number] Date of Birth: 28-Apr-1995  [facility]) CM/SW Contact  Marlowe Sax, RN Phone Number: 09/10/2022, 9:46 AM  Clinical Narrative:   The patient did not receive the elevated leg rests with the wheelchair, I called [employer] Supervisor Melissa and left a secure voice mail asking for the elevated leg rests to be delivered t\o her home as she has discharged      Barriers to Discharge: Barriers Resolved  Expected Discharge Plan and Services       Living arrangements for the past 2 months: Apartment Expected Discharge Date: 09/10/22               DME Arranged: 3-N-1, Wheelchair manual DME Agency: [employer] Date DME Agency Contacted: 09/09/22 Time DME Agency Contacted: 0900 Representative spoke with at DME Agency: Leavy Cella             Social Determinants of Health (SDOH) Interventions SDOH Screenings   Food Insecurity: No Food Insecurity (09/07/2022)  Housing: Low Risk  (09/07/2022)  Transportation Needs: No Transportation Needs (09/07/2022)  Utilities: Not At Risk (09/07/2022)  Tobacco Use: Low Risk  (09/09/2022)    Readmission Risk Interventions     No data to display

## 2022-09-10 NOTE — Progress Notes (Signed)
  Subjective: 3 Days Post-Op Procedure(s) (LRB): Left knee arthroscopy with chondroplasty, osteochondroplasty, osteochondral allograft implantation to patella, and anteromedializing tibial tubercle osteotomy (Left) chondroplasty (Left) Patient reports pain as moderate.   Patient is well, and has had no acute complaints or problems Plan is to go Home after hospital stay. Negative for chest pain and shortness of breath Fever: no Gastrointestinal: Negative for nausea and vomiting  Objective: Vital signs in last 24 hours: Temp:  [97.5 F (36.4 C)-98.2 F (36.8 C)] 97.5 F (36.4 C) (05/19 2303) Pulse Rate:  [92-93] 93 (05/19 2303) Resp:  [17-20] 17 (05/19 2303) BP: (104-117)/(80-81) 104/80 (05/19 2303) SpO2:  [98 %] 98 % (05/19 2303)  Intake/Output from previous day:  Intake/Output Summary (Last 24 hours) at 09/10/2022 0647 Last data filed at 09/09/2022 1600 Gross per 24 hour  Intake 720 ml  Output --  Net 720 ml    Intake/Output this shift: No intake/output data recorded.  Labs: No results for input(s): "HGB" in the last 72 hours. No results for input(s): "WBC", "RBC", "HCT", "PLT" in the last 72 hours. No results for input(s): "NA", "K", "CL", "CO2", "BUN", "CREATININE", "GLUCOSE", "CALCIUM" in the last 72 hours. No results for input(s): "LABPT", "INR" in the last 72 hours.   EXAM General - Patient is Alert and Oriented Extremity - Neurovascular intact Sensation intact distally Dorsiflexion/Plantar flexion intact Dressing/Incision - clean, dry, with the Hemovac removed with no complication.  The brace is locked in extension. Motor Function - intact, moving foot and toes well on exam.   Past Medical History:  Diagnosis Date   Asthma    Elevated serum creatinine    mild   GERD (gastroesophageal reflux disease)    rare   Glaucoma    Pneumonia 2019    Assessment/Plan: 3 Days Post-Op Procedure(s) (LRB): Left knee arthroscopy with chondroplasty, osteochondroplasty,  osteochondral allograft implantation to patella, and anteromedializing tibial tubercle osteotomy (Left) chondroplasty (Left) Principal Problem:   Chondral defect of patella  Estimated body mass index is 39.02 kg/m as calculated from the following:   Height as of this encounter: 5\' 7"  (1.702 m).   Weight as of this encounter: 113 kg. Advance diet Up with therapy D/C IV fluids  Discontinue IV narcotic use.  Discharge planning: Plan to send the patient home after completing physical therapy goals.  Follow-up at [facility] clinic orthopedics in 2 weeks for dressing change and suture removal.  DVT Prophylaxis - Aspirin Nonweightbearing to left leg for the first 4 weeks.  Dedra Skeens, PA-C Orthopaedic Surgery 09/10/2022, 6:47 AM

## 2022-09-10 NOTE — Progress Notes (Signed)
Physical Therapy Treatment Patient Details Name: Ashley Morrow MRN: [id number] DOB: 1995/04/30 Today's Date: 09/10/2022   History of Present Illness Ashley Morrow is a 27 year old female who underwent left knee patella osteochondral allograft implantation with tibial tubercle osteotomy and chondroplasty done by Dr. Allena Katz on 09/07/2022. She is non weight bearing on the left side.    PT Comments    Pt receiving discharge instructions upon arrival.  Dad in room and here for stair training.  She is able to transition into wheelchair with walker and min guard/supervision.  Taken to gym for step training and education on how to transition on/off floor.  She is able to do with supervision to min +2 depending on task but overall much more confident and able to complete tasks.  Pt feels comfortable and ready for discharge.  Pt to get walker on her way home as wheelchair was delivered.  Wheelchair was delivered with standard leg rests,  elevated were requested and [facility] is contacted to see if they can deliver to her home as she is ready to discharge.   Recommendations for follow up therapy are one component of a multi-disciplinary discharge planning process, led by the attending physician.  Recommendations may be updated based on patient status, additional functional criteria and insurance authorization.  Follow Up Recommendations  Can patient physically be transported by private vehicle: Yes    Assistance Recommended at Discharge Intermittent Supervision/Assistance  Patient can return home with the following A little help with walking and/or transfers;A little help with bathing/dressing/bathroom;Assistance with cooking/housework;Help with stairs or ramp for entrance (Total A +3 to navigate stairs for entrance/exit)   Equipment Recommendations  Rolling walker (2 wheels);BSC/3in1;Wheelchair (measurements PT)    Recommendations for Other Services       Precautions / Restrictions  Precautions Precautions: Fall Required Braces or Orthoses: Other Brace Other Brace: hinged knee brace locked at 0 degrees on L knee Restrictions Weight Bearing Restrictions: Yes LLE Weight Bearing: Non weight bearing Other Position/Activity Restrictions: Per Dr. Allena Katz: Keep knee fully extended at rest - NO PILLOWS UNDER KNEE.     Mobility  Bed Mobility Overal bed mobility: Needs Assistance Bed Mobility: Supine to Sit     Supine to sit: Supervision       Patient Response: Cooperative  Transfers Overall transfer level: Needs assistance Equipment used: Rolling walker (2 wheels) Transfers: Sit to/from Stand Sit to Stand: Min guard                Ambulation/Gait Ambulation/Gait assistance: Supervision, Min guard Gait Distance (Feet): 10 Feet Assistive device: Rolling walker (2 wheels) Gait Pattern/deviations: Step-to pattern Gait velocity: decreased     General Gait Details: walks to [facility] in room for stair training   Stairs   Stairs assistance: Min assist, +2 physical assistance Stair Management: Seated/boosting Number of Stairs: 3 General stair comments: discussed steps and how to transition on/off floor once up steps   Wheelchair Mobility    Modified Rankin (Stroke Patients Only)       Balance Overall balance assessment: Needs assistance Sitting-balance support: Feet supported Sitting balance-Leahy Scale: Good     Standing balance support: Bilateral upper extremity supported Standing balance-Leahy Scale: Good Standing balance comment: much more confident today                            Cognition Arousal/Alertness: Awake/alert Behavior During Therapy: WFL for tasks assessed/performed Overall Cognitive Status: Within Functional Limits for  tasks assessed                                          Exercises      General Comments        Pertinent Vitals/Pain Pain Assessment Pain Assessment: Faces Faces Pain  Scale: Hurts even more Pain Location: L knee Pain Descriptors / Indicators: Guarding, Grimacing Pain Intervention(s): Limited activity within patient's tolerance, Monitored during session, Premedicated before session, Repositioned    Home Living                          Prior Function            PT Goals (current goals can now be found in the care plan section) Progress towards PT goals: Progressing toward goals    Frequency    BID      PT Plan Current plan remains appropriate    Co-evaluation              AM-PAC PT "6 Clicks" Mobility   Outcome Measure  Help needed turning from your back to your side while in a flat bed without using bedrails?: None Help needed moving from lying on your back to sitting on the side of a flat bed without using bedrails?: None Help needed moving to and from a bed to a chair (including a wheelchair)?: A Little Help needed standing up from a chair using your arms (e.g., wheelchair or bedside chair)?: A Little Help needed to walk in hospital room?: A Little Help needed climbing 3-5 steps with a railing? : A Lot 6 Click Score: 19    End of Session Equipment Utilized During Treatment: Gait belt (L knee hinged brace locked at 0 degrees) Activity Tolerance: Patient limited by pain;Patient limited by fatigue Patient left: with call Mccaskey/phone within reach;with family/visitor present;in bed (polar care applied) Nurse Communication: Mobility status PT Visit Diagnosis: Difficulty in walking, not elsewhere classified (R26.2);Pain;Muscle weakness (generalized) (M62.81) Pain - Right/Left: Left Pain - part of body: Knee     Time: 1610-9604 PT Time Calculation (min) (ACUTE ONLY): 27 min  Charges:  $Therapeutic Activity: 23-37 mins                   Danielle Dess, PTA 09/10/22, 9:43 AM

## 2022-09-11 ENCOUNTER — Encounter: Payer: Self-pay | Admitting: Orthopedic Surgery

## 2022-09-26 ENCOUNTER — Encounter: Payer: Self-pay | Admitting: Orthopedic Surgery

## 2022-10-11 ENCOUNTER — Encounter: Payer: Self-pay | Admitting: Orthopedic Surgery

## 2023-08-27 ENCOUNTER — Other Ambulatory Visit: Payer: Self-pay | Admitting: Orthopedic Surgery

## 2023-08-27 DIAGNOSIS — M2242 Chondromalacia patellae, left knee: Secondary | ICD-10-CM

## 2023-08-28 ENCOUNTER — Encounter: Payer: Self-pay | Admitting: Orthopedic Surgery

## 2023-08-29 ENCOUNTER — Ambulatory Visit
Admission: RE | Admit: 2023-08-29 | Discharge: 2023-08-29 | Disposition: A | Discharge status: Home / Self Care | Source: Ambulatory Visit | Attending: Orthopedic Surgery | Admitting: Orthopedic Surgery

## 2023-08-29 DIAGNOSIS — M2242 Chondromalacia patellae, left knee: Secondary | ICD-10-CM
